# Patient Record
Sex: Female | Born: 1990 | Hispanic: Yes | Marital: Single | Smoking: Never smoker
Health system: Southern US, Community
[De-identification: ages and names within clinical notes are randomized; demographics above are authoritative.]

## PROBLEM LIST (undated history)

## (undated) DIAGNOSIS — O24419 Gestational diabetes mellitus in pregnancy, unspecified control: Secondary | ICD-10-CM

## (undated) DIAGNOSIS — I10 Essential (primary) hypertension: Secondary | ICD-10-CM

## (undated) DIAGNOSIS — O139 Gestational [pregnancy-induced] hypertension without significant proteinuria, unspecified trimester: Secondary | ICD-10-CM

## (undated) HISTORY — PX: NO PAST SURGERIES: SHX2092

## (undated) HISTORY — DX: Gestational (pregnancy-induced) hypertension without significant proteinuria, unspecified trimester: O13.9

## (undated) HISTORY — PX: CHOLECYSTECTOMY: SHX55

## (undated) HISTORY — DX: Essential (primary) hypertension: I10

---

## 2018-07-11 DIAGNOSIS — I10 Essential (primary) hypertension: Secondary | ICD-10-CM | POA: Insufficient documentation

## 2018-12-26 ENCOUNTER — Ambulatory Visit (INDEPENDENT_AMBULATORY_CARE_PROVIDER_SITE_OTHER): Payer: BC Managed Care – PPO

## 2018-12-26 ENCOUNTER — Other Ambulatory Visit: Payer: Self-pay

## 2018-12-26 VITALS — Ht 59.0 in

## 2018-12-26 DIAGNOSIS — Z3201 Encounter for pregnancy test, result positive: Secondary | ICD-10-CM

## 2018-12-26 DIAGNOSIS — Z3401 Encounter for supervision of normal first pregnancy, first trimester: Secondary | ICD-10-CM

## 2018-12-26 DIAGNOSIS — Z3490 Encounter for supervision of normal pregnancy, unspecified, unspecified trimester: Secondary | ICD-10-CM | POA: Insufficient documentation

## 2018-12-26 LAB — POCT URINE PREGNANCY: Preg Test, Ur: POSITIVE — AB

## 2018-12-26 MED ORDER — NIFEDIPINE ER OSMOTIC RELEASE 30 MG PO TB24: 30.0000 mg | ORAL_TABLET | Freq: Every day | ORAL | 0 refills | Status: DC

## 2018-12-26 MED ORDER — BLOOD PRESSURE MONITOR/M CUFF MISC: 0 refills | Status: DC

## 2018-12-26 MED ORDER — BLOOD PRESSURE KIT: PACK | 0 refills | Status: DC

## 2018-12-26 NOTE — Progress Notes (Signed)
Connie Mccarty presents today for UPT. She has no unusual complaints . LMP:11/04/18    OBJECTIVE: Appears well, in no apparent distress.  OB History    Gravida  1   Para      Term      Preterm      AB      Living        SAB      TAB      Ectopic      Multiple      Live Births             Home UPT Result: Positive In-Office UPT result: Posivite I have reviewed the patient's medical, obstetrical, social, and family histories, and medications.   ASSESSMENT: Positive pregnancy test  PLAN Prenatal care to be completed at: [facility]

## 2018-12-27 NOTE — Progress Notes (Signed)
I have reviewed this chart and agree with the RN/CMA assessment and management.    K. Meryl Davis, M.D. Attending [facility] (Faculty Practice)   

## 2019-01-04 ENCOUNTER — Encounter: Payer: Self-pay | Admitting: Women's Health

## 2019-01-04 ENCOUNTER — Ambulatory Visit (INDEPENDENT_AMBULATORY_CARE_PROVIDER_SITE_OTHER): Payer: BC Managed Care – PPO | Admitting: Women's Health

## 2019-01-04 ENCOUNTER — Other Ambulatory Visit: Payer: Self-pay

## 2019-01-04 VITALS — BP 155/100 | HR 106 | Wt 159.0 lb

## 2019-01-04 DIAGNOSIS — O10919 Unspecified pre-existing hypertension complicating pregnancy, unspecified trimester: Secondary | ICD-10-CM

## 2019-01-04 DIAGNOSIS — Z3401 Encounter for supervision of normal first pregnancy, first trimester: Secondary | ICD-10-CM

## 2019-01-04 NOTE — Patient Instructions (Signed)
The Maternity Assessment Unit (MAU) is located at the [facility]. The address is: [address], [place] [address], [place], [place] [id number]. Please see map below for additional directions.    The Maternity Assessment Unit is designed to help you during your pregnancy, and for up to 6 weeks after delivery, with any pregnancy- or postpartum-related emergencies, if you think you are in labor, or if your water has broken. For example, if you experience nausea and vomiting, vaginal bleeding, severe abdominal or pelvic pain, elevated blood pressure or other problems related to your pregnancy or postpartum time, please come to the Maternity Assessment Unit for assistance.                   Safe Medications in Pregnancy    Acne: Benzoyl Peroxide Salicylic Acid  Backache/Headache: Tylenol: 2 regular strength every 4 hours OR              2 Extra strength every 6 hours  Colds/Coughs/Allergies: Benadryl (alcohol free) 25 mg every 6 hours as needed Breath right strips Claritin Cepacol throat lozenges Chloraseptic throat spray Cold-Eeze- up to three times per day Cough drops, alcohol free Flonase (by prescription only) Guaifenesin Mucinex Robitussin DM (plain only, alcohol free) Saline nasal spray/drops Sudafed (pseudoephedrine) & Actifed ** use only after [redacted] weeks gestation and if you do not have high blood pressure Tylenol Vicks Vaporub Zinc lozenges Zyrtec   Constipation: Colace Ducolax suppositories Fleet enema Glycerin suppositories Metamucil Milk of magnesia Miralax Senokot Smooth move tea  Diarrhea: Kaopectate Imodium A-D  *NO pepto Bismol  Hemorrhoids: Anusol Anusol HC Preparation H Tucks  Indigestion: Tums Maalox Mylanta Zantac  Pepcid  Insomnia: Benadryl (alcohol free) 25mg  every 6 hours as needed Tylenol PM Unisom, no Gelcaps  Leg Cramps: Tums MagGel  Nausea/Vomiting:  Bonine Dramamine Emetrol  Ginger extract Sea bands Meclizine  Nausea medication to take during pregnancy:  Unisom (doxylamine succinate 25 mg tablets) Take one tablet daily at bedtime. If symptoms are not adequately controlled, the dose can be increased to a maximum recommended dose of two tablets daily (1/2 tablet in the morning, 1/2 tablet mid-afternoon and one at bedtime). Vitamin B6 100mg  tablets. Take one tablet twice a day (up to 200 mg per day).  Skin Rashes: Aveeno products Benadryl cream or 25mg  every 6 hours as needed Calamine Lotion 1% cortisone cream  Yeast infection: Gyne-lotrimin 7 Monistat 7   **If taking multiple medications, please check labels to avoid duplicating the same active ingredients **take medication as directed on the label ** Do not exceed 4000 mg of tylenol in 24 hours **Do not take medications that contain aspirin or ibuprofen     Hypertension During Pregnancy High blood pressure (hypertension) is when the force of blood pumping through the arteries is too strong. Arteries are blood vessels that carry blood from the heart throughout the body. Hypertension during pregnancy can be mild or severe. Severe hypertension during pregnancy (preeclampsia) is a medical emergency that requires prompt evaluation and treatment. Different types of hypertension can happen during pregnancy. These include:  Chronic hypertension. This happens when you had high blood pressure before you became pregnant, and it continues during the pregnancy. Hypertension that develops before you are [redacted] weeks pregnant and continues during the pregnancy is also called chronic hypertension. If you have chronic hypertension, it will not go away after you have your baby. You will need follow-up visits with your health care provider after you have  your baby. Your doctor may want you to keep taking medicine for your blood pressure.  Gestational hypertension. This is hypertension that develops after the 20th week of  pregnancy. Gestational hypertension usually goes away after you have your baby, but your health care provider will need to monitor your blood pressure to make sure that it is getting better.  Preeclampsia. This is severe hypertension during pregnancy. This can cause serious complications for you and your baby and can also cause complications for you after the delivery of your baby.  Postpartum preeclampsia. You may develop severe hypertension after giving birth. This usually occurs within 48 hours after childbirth but may occur up to 6 weeks after giving birth. This is rare. How does this affect me? Women who have hypertension during pregnancy have a greater chance of developing hypertension later in life or during future pregnancies. In some cases, hypertension during pregnancy can cause serious complications, such as:  Stroke.  Heart attack.  Injury to other organs, such as kidneys, lungs, or liver.  Preeclampsia.  Convulsions or seizures.  Placental abruption. How does this affect my baby? Hypertension during pregnancy can affect your baby. Your baby may:  Be born early (prematurely).  Not weigh as much as he or she should at birth (low birth weight).  Not tolerate labor well, leading to an unplanned cesarean delivery. What are the risks? There are certain factors that make it more likely for you to develop hypertension during pregnancy. These include:  Having hypertension during a previous pregnancy.  Being overweight.  Being age 6 or older.  Being pregnant for the first time.  Being pregnant with more than one baby.  Becoming pregnant using fertilization methods, such as IVF (in vitro fertilization).  Having other medical problems, such as diabetes, kidney disease, or lupus.  Having a family history of hypertension. What can I do to lower my risk? The exact cause of hypertension during pregnancy is not known. You may be able to lower your risk by:  Maintaining a  healthy weight.  Eating a healthy and balanced diet.  Following your health care provider's instructions about treating any long-term conditions that you had before becoming pregnant. It is very important to keep all of your prenatal care appointments. Your health care provider will check your blood pressure and make sure that your pregnancy is progressing as expected. If a problem is found, early treatment can prevent complications. How is this treated? Treatment for hypertension during pregnancy varies depending on the type of hypertension you have and how serious it is.  If you were taking medicine for high blood pressure before you became pregnant, talk with your health care provider. You may need to change medicine during pregnancy because some medicines, like ACE inhibitors, may not be considered safe for your baby.  If you have gestational hypertension, your health care provider may order medicine to treat this during pregnancy.  If you are at risk for preeclampsia, your health care provider may recommend that you take a low-dose aspirin during your pregnancy.  If you have severe hypertension, you may need to be hospitalized so you and your baby can be monitored closely. You may also need to be given medicine to lower your blood pressure. This medicine may be given by mouth or through an IV.  In some cases, if your condition gets worse, you may need to deliver your baby early. Follow these instructions at home: Eating and drinking   Drink enough fluid to keep your urine  pale yellow.  Avoid caffeine. Lifestyle  Do not use any products that contain nicotine or tobacco, such as cigarettes, e-cigarettes, and chewing tobacco. If you need help quitting, ask your health care provider.  Do not use alcohol or drugs.  Avoid stress as much as possible.  Rest and get plenty of sleep.  Regular exercise can help to reduce your blood pressure. Ask your health care provider what kinds of  exercise are best for you. General instructions  Take over-the-counter and prescription medicines only as told by your health care provider.  Keep all prenatal and follow-up visits as told by your health care provider. This is important. Contact a health care provider if:  You have symptoms that your health care provider told you may require more treatment or monitoring, such as: ? Headaches. ? Nausea or vomiting. ? Abdominal pain. ? Dizziness. ? Light-headedness. Get help right away if:  You have: ? Severe abdominal pain that does not get better with treatment. ? A severe headache that does not get better. ? Vomiting that does not get better. ? Sudden, rapid weight gain. ? Sudden swelling in your hands, ankles, or face. ? Vaginal bleeding. ? Blood in your urine. ? Blurred or double vision. ? Shortness of breath or chest pain. ? Weakness on one side of your body. ? Difficulty speaking.  Your baby is not moving as much as usual. Summary  High blood pressure (hypertension) is when the force of blood pumping through the arteries is too strong.  Hypertension during pregnancy can cause problems for you and your baby.  Treatment for hypertension during pregnancy varies depending on the type of hypertension you have and how serious it is.  Keep all prenatal and follow-up visits as told by your health care provider. This is important. This information is not intended to replace advice given to you by your health care provider. Make sure you discuss any questions you have with your health care provider. Document Released: 10/20/2010 Document Revised: 05/25/2018 Document Reviewed: 02/28/2018 Elsevier Patient Education  2020 [place] of Pregnancy The first trimester of pregnancy is from week 1 until the end of week 13 (months 1 through 3). A week after a sperm fertilizes an egg, the egg will implant on the wall of the uterus. This embryo will begin to  develop into a baby. Genes from you and your partner will form the baby. The female genes will determine whether the baby will be a boy or a girl. At 6-8 weeks, the eyes and face will be formed, and the heartbeat can be seen on ultrasound. At the end of 12 weeks, all the baby's organs will be formed. Now that you are pregnant, you will want to do everything you can to have a healthy baby. Two of the most important things are to get good prenatal care and to follow your health care provider's instructions. Prenatal care is all the medical care you receive before the baby's birth. This care will help prevent, find, and treat any problems during the pregnancy and childbirth. Body changes during your first trimester Your body goes through many changes during pregnancy. The changes vary from woman to woman.  You may gain or lose a couple of pounds at first.  You may feel sick to your stomach (nauseous) and you may throw up (vomit). If the vomiting is uncontrollable, call your health care provider.  You may tire easily.  You may develop headaches that can  be relieved by medicines. All medicines should be approved by your health care provider.  You may urinate more often. Painful urination may mean you have a bladder infection.  You may develop heartburn as a result of your pregnancy.  You may develop constipation because certain hormones are causing the muscles that push stool through your intestines to slow down.  You may develop hemorrhoids or swollen veins (varicose veins).  Your breasts may begin to grow larger and become tender. Your nipples may stick out more, and the tissue that surrounds them (areola) may become darker.  Your gums may bleed and may be sensitive to brushing and flossing.  Dark spots or blotches (chloasma, mask of pregnancy) may develop on your face. This will likely fade after the baby is born.  Your menstrual periods will stop.  You may have a loss of appetite.  You  may develop cravings for certain kinds of food.  You may have changes in your emotions from day to day, such as being excited to be pregnant or being concerned that something may go wrong with the pregnancy and baby.  You may have more vivid and strange dreams.  You may have changes in your hair. These can include thickening of your hair, rapid growth, and changes in texture. Some women also have hair loss during or after pregnancy, or hair that feels dry or thin. Your hair will most likely return to normal after your baby is born. What to expect at prenatal visits During a routine prenatal visit:  You will be weighed to make sure you and the baby are growing normally.  Your blood pressure will be taken.  Your abdomen will be measured to track your baby's growth.  The fetal heartbeat will be listened to between weeks 10 and 14 of your pregnancy.  Test results from any previous visits will be discussed. Your health care provider may ask you:  How you are feeling.  If you are feeling the baby move.  If you have had any abnormal symptoms, such as leaking fluid, bleeding, severe headaches, or abdominal cramping.  If you are using any tobacco products, including cigarettes, chewing tobacco, and electronic cigarettes.  If you have any questions. Other tests that may be performed during your first trimester include:  Blood tests to find your blood type and to check for the presence of any previous infections. The tests will also be used to check for low iron levels (anemia) and protein on red blood cells (Rh antibodies). Depending on your risk factors, or if you previously had diabetes during pregnancy, you may have tests to check for high blood sugar that affects pregnant women (gestational diabetes).  Urine tests to check for infections, diabetes, or protein in the urine.  An ultrasound to confirm the proper growth and development of the baby.  Fetal screens for spinal cord problems  (spina bifida) and Down syndrome.  HIV (human immunodeficiency virus) testing. Routine prenatal testing includes screening for HIV, unless you choose not to have this test.  You may need other tests to make sure you and the baby are doing well. Follow these instructions at home: Medicines  Follow your health care provider's instructions regarding medicine use. Specific medicines may be either safe or unsafe to take during pregnancy.  Take a prenatal vitamin that contains at least 600 micrograms (mcg) of folic acid.  If you develop constipation, try taking a stool softener if your health care provider approves. Eating and drinking   Eat  a balanced diet that includes fresh fruits and vegetables, whole grains, good sources of protein such as meat, eggs, or tofu, and low-fat dairy. Your health care provider will help you determine the amount of weight gain that is right for you.  Avoid raw meat and uncooked cheese. These carry germs that can cause birth defects in the baby.  Eating four or five small meals rather than three large meals a day may help relieve nausea and vomiting. If you start to feel nauseous, eating a few soda crackers can be helpful. Drinking liquids between meals, instead of during meals, also seems to help ease nausea and vomiting.  Limit foods that are high in fat and processed sugars, such as fried and sweet foods.  To prevent constipation: ? Eat foods that are high in fiber, such as fresh fruits and vegetables, whole grains, and beans. ? Drink enough fluid to keep your urine clear or pale yellow. Activity  Exercise only as directed by your health care provider. Most women can continue their usual exercise routine during pregnancy. Try to exercise for 30 minutes at least 5 days a week. Exercising will help you: ? Control your weight. ? Stay in shape. ? Be prepared for labor and delivery.  Experiencing pain or cramping in the lower abdomen or lower back is a good  sign that you should stop exercising. Check with your health care provider before continuing with normal exercises.  Try to avoid standing for long periods of time. Move your legs often if you must stand in one place for a long time.  Avoid heavy lifting.  Wear low-heeled shoes and practice good posture.  You may continue to have sex unless your health care provider tells you not to. Relieving pain and discomfort  Wear a good support bra to relieve breast tenderness.  Take warm sitz baths to soothe any pain or discomfort caused by hemorrhoids. Use hemorrhoid cream if your health care provider approves.  Rest with your legs elevated if you have leg cramps or low back pain.  If you develop varicose veins in your legs, wear support hose. Elevate your feet for 15 minutes, 3-4 times a day. Limit salt in your diet. Prenatal care  Schedule your prenatal visits by the twelfth week of pregnancy. They are usually scheduled monthly at first, then more often in the last 2 months before delivery.  Write down your questions. Take them to your prenatal visits.  Keep all your prenatal visits as told by your health care provider. This is important. Safety  Wear your seat belt at all times when driving.  Make a list of emergency phone numbers, including numbers for family, friends, the hospital, and police and fire departments. General instructions  Ask your health care provider for a referral to a local prenatal education class. Begin classes no later than the beginning of month 6 of your pregnancy.  Ask for help if you have counseling or nutritional needs during pregnancy. Your health care provider can offer advice or refer you to specialists for help with various needs.  Do not use hot tubs, steam rooms, or saunas.  Do not douche or use tampons or scented sanitary pads.  Do not cross your legs for long periods of time.  Avoid cat litter boxes and soil used by cats. These carry germs that  can cause birth defects in the baby and possibly loss of the fetus by miscarriage or stillbirth.  Avoid all smoking, herbs, alcohol, and medicines not prescribed  by your health care provider. Chemicals in these products affect the formation and growth of the baby.  Do not use any products that contain nicotine or tobacco, such as cigarettes and e-cigarettes. If you need help quitting, ask your health care provider. You may receive counseling support and other resources to help you quit.  Schedule a dentist appointment. At home, brush your teeth with a soft toothbrush and be gentle when you floss. Contact a health care provider if:  You have dizziness.  You have mild pelvic cramps, pelvic pressure, or nagging pain in the abdominal area.  You have persistent nausea, vomiting, or diarrhea.  You have a bad smelling vaginal discharge.  You have pain when you urinate.  You notice increased swelling in your face, hands, legs, or ankles.  You are exposed to fifth disease or chickenpox.  You are exposed to [place] measles (rubella) and have never had it. Get help right away if:  You have a fever.  You are leaking fluid from your vagina.  You have spotting or bleeding from your vagina.  You have severe abdominal cramping or pain.  You have rapid weight gain or loss.  You vomit blood or material that looks like coffee grounds.  You develop a severe headache.  You have shortness of breath.  You have any kind of trauma, such as from a fall or a car accident. Summary  The first trimester of pregnancy is from week 1 until the end of week 13 (months 1 through 3).  Your body goes through many changes during pregnancy. The changes vary from woman to woman.  You will have routine prenatal visits. During those visits, your health care provider will examine you, discuss any test results you may have, and talk with you about how you are feeling. This information is not intended to replace  advice given to you by your health care provider. Make sure you discuss any questions you have with your health care provider. Document Released: 01/26/2001 Document Revised: 01/14/2017 Document Reviewed: 01/14/2016 Elsevier Patient Education  2020 [employer] Preeclampsia and Eclampsia Preeclampsia is a serious condition that may develop during pregnancy. This condition causes high blood pressure and increased protein in your urine along with other symptoms, such as headaches and vision changes. These symptoms may develop as the condition gets worse. Preeclampsia may occur at 20 weeks of pregnancy or later. Diagnosing and treating preeclampsia early is very important. If not treated early, it can cause serious problems for you and your baby. One problem it can lead to is eclampsia. Eclampsia is a condition that causes muscle jerking or shaking (convulsions or seizures) and other serious problems for the mother. During pregnancy, delivering your baby may be the best treatment for preeclampsia or eclampsia. For most women, preeclampsia and eclampsia symptoms go away after giving birth. In rare cases, a woman may develop preeclampsia after giving birth (postpartum preeclampsia). This usually occurs within 48 hours after childbirth but may occur up to 6 weeks after giving birth. What are the causes? The cause of preeclampsia is not known. What increases the risk? The following risk factors make you more likely to develop preeclampsia:  Being pregnant for the first time.  Having had preeclampsia during a past pregnancy.  Having a family history of preeclampsia.  Having high blood pressure.  Being pregnant with more than one baby.  Being 57 or older.  Being African-American.  Having kidney disease or diabetes.  Having medical conditions such as lupus or blood  diseases.  Being very overweight (obese). What are the signs or symptoms? The most common symptoms are:  Severe headaches.   Vision problems, such as blurred or double vision.  Abdominal pain, especially upper abdominal pain. Other symptoms that may develop as the condition gets worse include:  Sudden weight gain.  Sudden swelling of the hands, face, legs, and feet.  Severe nausea and vomiting.  Numbness in the face, arms, legs, and feet.  Dizziness.  Urinating less than usual.  Slurred speech.  Convulsions or seizures. How is this diagnosed? There are no screening tests for preeclampsia. Your health care provider will ask you about symptoms and check for signs of preeclampsia during your prenatal visits. You may also have tests that include:  Checking your blood pressure.  Urine tests to check for protein. Your health care provider will check for this at every prenatal visit.  Blood tests.  Monitoring your baby's heart rate.  Ultrasound. How is this treated? You and your health care provider will determine the treatment approach that is best for you. Treatment may include:  Having more frequent prenatal exams to check for signs of preeclampsia, if you have an increased risk for preeclampsia.  Medicine to lower your blood pressure.  Staying in the hospital, if your condition is severe. There, treatment will focus on controlling your blood pressure and the amount of fluids in your body (fluid retention).  Taking medicine (magnesium sulfate) to prevent seizures. This may be given as an injection or through an IV.  Taking a low-dose aspirin during your pregnancy.  Delivering your baby early. You may have your labor started with medicine (induced), or you may have a cesarean delivery. Follow these instructions at home: Eating and drinking   Drink enough fluid to keep your urine pale yellow.  Avoid caffeine. Lifestyle  Do not use any products that contain nicotine or tobacco, such as cigarettes and e-cigarettes. If you need help quitting, ask your health care provider.  Do not use  alcohol or drugs.  Avoid stress as much as possible. Rest and get plenty of sleep. General instructions  Take over-the-counter and prescription medicines only as told by your health care provider.  When lying down, lie on your left side. This keeps pressure off your major blood vessels.  When sitting or lying down, raise (elevate) your feet. Try putting some pillows underneath your lower legs.  Exercise regularly. Ask your health care provider what kinds of exercise are best for you.  Keep all follow-up and prenatal visits as told by your health care provider. This is important. How is this prevented? There is no known way of preventing preeclampsia or eclampsia from developing. However, to lower your risk of complications and detect problems early:  Get regular prenatal care. Your health care provider may be able to diagnose and treat the condition early.  Maintain a healthy weight. Ask your health care provider for help managing weight gain during pregnancy.  Work with your health care provider to manage any long-term (chronic) health conditions you have, such as diabetes or kidney problems.  You may have tests of your blood pressure and kidney function after giving birth.  Your health care provider may have you take low-dose aspirin during your next pregnancy. Contact a health care provider if:  You have symptoms that your health care provider told you may require more treatment or monitoring, such as: ? Headaches. ? Nausea or vomiting. ? Abdominal pain. ? Dizziness. ? Light-headedness. Get help right away  if:  You have severe: ? Abdominal pain. ? Headaches that do not get better. ? Dizziness. ? Vision problems. ? Confusion. ? Nausea or vomiting.  You have any of the following: ? A seizure. ? Sudden, rapid weight gain. ? Sudden swelling in your hands, ankles, or face. ? Trouble moving any part of your body. ? Numbness in any part of your body. ? Trouble speaking.  ? Abnormal bleeding.  You faint. Summary  Preeclampsia is a serious condition that may develop during pregnancy.  This condition causes high blood pressure and increased protein in your urine along with other symptoms, such as headaches and vision changes.  Diagnosing and treating preeclampsia early is very important. If not treated early, it can cause serious problems for you and your baby.  Get help right away if you have symptoms that your health care provider told you to watch for. This information is not intended to replace advice given to you by your health care provider. Make sure you discuss any questions you have with your health care provider. Document Released: 01/30/2000 Document Revised: 10/04/2017 Document Reviewed: 09/08/2015 Elsevier Patient Education  2020 [employer].

## 2019-01-04 NOTE — Progress Notes (Signed)
History:   Connie Mccarty is a 28 y.o. G1P0 at 22w5dby LMP being seen today for her first obstetrical visit. FHT unable to be obtained by Doppler or bedside [place] Will defer NOB visit pending viability scan. Pt aware and agrees with plan.  Patient reports no complaints.      HISTORY: OB History  Gravida Para Term Preterm AB Living  1 0 0 0 0 0  SAB TAB Ectopic Multiple Live Births  0 0 0 0 0    # Outcome Date GA Lbr Len/2nd Weight Sex Delivery Anes PTL Lv  1 Current             Last pap smear was done 07/2018 - NILM.  Past Medical History:  Diagnosis Date  . Hypertension    History reviewed. No pertinent surgical history. Family History  Problem Relation Age of Onset  . Diabetes Father   . Diabetes Mother    Social History   Tobacco Use  . Smoking status: Never Smoker  . Smokeless tobacco: Never Used  Substance Use Topics  . Alcohol use: Never    Frequency: Never  . Drug use: Never   No Known Allergies Current Outpatient Medications on File Prior to Visit  Medication Sig Dispense Refill  . Blood Pressure KIT Use as directed 1 kit 0  . NIFEdipine (PROCARDIA XL) 30 MG 24 hr tablet Take 1 tablet (30 mg total) by mouth daily. 30 tablet 0  . Blood Pressure Monitoring (BLOOD PRESSURE MONITOR/M CUFF) MISC Use as directed 1 each 0   No current facility-administered medications on file prior to visit.     Review of Systems Pertinent items noted in HPI and remainder of comprehensive ROS otherwise negative. Physical Exam:   Vitals:   01/04/19 1407 01/04/19 1424  BP: (!) 157/106 (!) 155/100  Pulse: (!) 106   Weight: 159 lb (72.1 kg)      Uterus:    not evaluated  Pelvic Exam: Perineum: not evaluated   Vulva: not evaluated   Vagina:  not evaluated   Cervix: not evaluated    Adnexa: not evaluated   Bony Pelvis: not evaluated  System: General: well-developed, well-nourished female in no acute distress   Breasts:  not evaluated   Skin: normal coloration and  turgor, no rashes   Neurologic: oriented, normal, negative, normal mood   Extremities: normal strength, tone, and muscle mass, ROM of all joints is normal   HEENT PERRLA, extraocular movement intact and sclera clear, anicteric   Mouth/Teeth not evaluated   Neck not evaluated   Cardiovascular: not evaluated   Respiratory:  no respiratory distress   Abdomen: soft  Bedside Ultrasound for FHR check: FHTs no able to be obtained with bedside [place] Pt reports no early formal [place] Sac appears irregularly shaped. Patient informed that the ultrasound is considered a limited obstetric ultrasound and is not intended to be a complete ultrasound exam.  Patient also informed that the ultrasound is not being completed with the intent of assessing for fetal or placental anomalies or any pelvic abnormalities.  Explained that the purpose of today's ultrasound is to assess for fetal heart rate.  Patient acknowledges the purpose of the exam and the limitations of the study.     Assessment:    Pregnancy: G1P0 Patient Active Problem List   Diagnosis Date Noted  . Chronic hypertension affecting pregnancy 01/04/2019  . Supervision of normal pregnancy 12/26/2018     Plan:    1. Encounter for  supervision of normal first pregnancy in first trimester - reschedule NOB visit and labs pending [place]  2. Chronic hypertension affecting pregnancy - anticipatory guidance given on visits and testing - increase Procardia 67m XR once per day (per consultation with Dr. DRosana Hoes - ASA '@12wks'  - baseline labs pending viability [place]  Return in about 1 week (around 01/11/2019) for BP check only, needs viability scan ASAP, NOB 2wks.    NClarisa Fling NP  3:35 PM 01/04/2019 [facility] [employer] [facility]

## 2019-01-04 NOTE — Progress Notes (Signed)
Pt presents for NOB visit. Normal pap 08/14/2018 Elevated BP today, asymptomatic, 1 tab po Procardia 30 mg at 1245 today.

## 2019-01-05 LAB — PROTEIN / CREATININE RATIO, URINE
Creatinine, Urine: 112 mg/dL
Protein, Ur: 9.7 mg/dL
Protein/Creat Ratio: 87 mg/g{creat} (ref 0–200)

## 2019-01-08 LAB — CULTURE, OB URINE

## 2019-01-08 LAB — URINE CULTURE, OB REFLEX

## 2019-01-10 ENCOUNTER — Other Ambulatory Visit: Payer: Self-pay

## 2019-01-10 ENCOUNTER — Ambulatory Visit: Payer: BC Managed Care – PPO

## 2019-01-10 VITALS — BP 150/97 | HR 103

## 2019-01-10 DIAGNOSIS — Z0130 Encounter for examination of blood pressure without abnormal findings: Secondary | ICD-10-CM

## 2019-01-10 NOTE — Progress Notes (Signed)
..  Subjective:  Connie Mccarty is a 28 y.o. female here for BP check.   Hypertension ROS: taking medications as instructed, no medication side effects noted, no TIA's, no chest pain on exertion, no dyspnea on exertion and no swelling of ankles.    Objective:  BP (!) 150/95   Pulse (!) 112   LMP 11/04/2018   Appearance alert, well appearing, and in no distress. General exam BP noted to be well controlled today in office.    Assessment:   Blood Pressure needs improvement.   Plan:  Consulted with provider and advised patient to continue Procardia, but take 30mg  BID versus taking 60mg  once a day, pt agreed. Next appt 01-22-19 for NOB.

## 2019-01-11 NOTE — Progress Notes (Signed)
Patient seen and assessed by nursing staff during this encounter. I have reviewed the chart and agree with the documentation and plan.  Mora Bellman, MD 01/11/2019 8:52 AM

## 2019-01-17 ENCOUNTER — Ambulatory Visit (HOSPITAL_COMMUNITY): Payer: BC Managed Care – PPO

## 2019-01-17 ENCOUNTER — Other Ambulatory Visit: Payer: Self-pay

## 2019-01-17 MED ORDER — NIFEDIPINE ER OSMOTIC RELEASE 30 MG PO TB24: 30.0000 mg | ORAL_TABLET | Freq: Every day | ORAL | 3 refills | Status: DC

## 2019-01-19 ENCOUNTER — Other Ambulatory Visit: Payer: Self-pay | Admitting: Obstetrics and Gynecology

## 2019-01-22 ENCOUNTER — Encounter: Payer: BC Managed Care – PPO | Admitting: Advanced Practice Midwife

## 2019-02-15 LAB — OB RESULTS CONSOLE RUBELLA ANTIBODY, IGM: Rubella: IMMUNE

## 2019-02-15 LAB — OB RESULTS CONSOLE HEPATITIS B SURFACE ANTIGEN: Hepatitis B Surface Ag: NEGATIVE

## 2019-02-15 LAB — OB RESULTS CONSOLE RPR: RPR: NONREACTIVE

## 2019-02-15 LAB — OB RESULTS CONSOLE ABO/RH: RH Type: POSITIVE

## 2019-02-15 LAB — OB RESULTS CONSOLE GC/CHLAMYDIA
Chlamydia: NEGATIVE
Gonorrhea: NEGATIVE

## 2019-02-15 LAB — OB RESULTS CONSOLE HIV ANTIBODY (ROUTINE TESTING): HIV: NONREACTIVE

## 2019-02-15 LAB — OB RESULTS CONSOLE ANTIBODY SCREEN: Antibody Screen: NEGATIVE

## 2019-03-21 ENCOUNTER — Other Ambulatory Visit: Payer: Self-pay | Admitting: Obstetrics and Gynecology

## 2019-06-12 ENCOUNTER — Other Ambulatory Visit: Payer: Self-pay | Admitting: Obstetrics and Gynecology

## 2019-06-19 ENCOUNTER — Encounter (HOSPITAL_COMMUNITY): Payer: Self-pay | Admitting: Obstetrics and Gynecology

## 2019-06-19 ENCOUNTER — Other Ambulatory Visit: Payer: Self-pay

## 2019-06-19 ENCOUNTER — Inpatient Hospital Stay (HOSPITAL_COMMUNITY)
Admission: AD | Admit: 2019-06-19 | Discharge: 2019-06-19 | Disposition: A | Discharge status: Home / Self Care | Payer: BC Managed Care – PPO | Attending: Obstetrics and Gynecology | Admitting: Obstetrics and Gynecology

## 2019-06-19 DIAGNOSIS — Z3A32 32 weeks gestation of pregnancy: Secondary | ICD-10-CM | POA: Diagnosis not present

## 2019-06-19 DIAGNOSIS — O10013 Pre-existing essential hypertension complicating pregnancy, third trimester: Secondary | ICD-10-CM | POA: Diagnosis not present

## 2019-06-19 DIAGNOSIS — O113 Pre-existing hypertension with pre-eclampsia, third trimester: Secondary | ICD-10-CM | POA: Diagnosis not present

## 2019-06-19 DIAGNOSIS — R030 Elevated blood-pressure reading, without diagnosis of hypertension: Secondary | ICD-10-CM | POA: Diagnosis present

## 2019-06-19 DIAGNOSIS — O119 Pre-existing hypertension with pre-eclampsia, unspecified trimester: Secondary | ICD-10-CM

## 2019-06-19 LAB — CBC
HCT: 37.2 % (ref 36.0–46.0)
Hemoglobin: 11.9 g/dL — ABNORMAL LOW (ref 12.0–15.0)
MCH: 27.3 pg (ref 26.0–34.0)
MCHC: 32 g/dL (ref 30.0–36.0)
MCV: 85.3 fL (ref 80.0–100.0)
Platelets: 317 10*3/uL (ref 150–400)
RBC: 4.36 MIL/uL (ref 3.87–5.11)
RDW: 16.4 % — ABNORMAL HIGH (ref 11.5–15.5)
WBC: 10.3 10*3/uL (ref 4.0–10.5)
nRBC: 0 % (ref 0.0–0.2)

## 2019-06-19 LAB — URINALYSIS, ROUTINE W REFLEX MICROSCOPIC
Bacteria, UA: NONE SEEN
Bilirubin Urine: NEGATIVE
Glucose, UA: NEGATIVE mg/dL
Ketones, ur: NEGATIVE mg/dL
Leukocytes,Ua: NEGATIVE
Nitrite: NEGATIVE
Protein, ur: NEGATIVE mg/dL
Specific Gravity, Urine: 1.004 — ABNORMAL LOW (ref 1.005–1.030)
pH: 6 (ref 5.0–8.0)

## 2019-06-19 LAB — COMPREHENSIVE METABOLIC PANEL
ALT: 15 U/L (ref 0–44)
AST: 19 U/L (ref 15–41)
Albumin: 2.9 g/dL — ABNORMAL LOW (ref 3.5–5.0)
Alkaline Phosphatase: 120 U/L (ref 38–126)
Anion gap: 12 (ref 5–15)
BUN: 5 mg/dL — ABNORMAL LOW (ref 6–20)
CO2: 18 mmol/L — ABNORMAL LOW (ref 22–32)
Calcium: 9.3 mg/dL (ref 8.9–10.3)
Chloride: 107 mmol/L (ref 98–111)
Creatinine, Ser: 0.61 mg/dL (ref 0.44–1.00)
GFR calc Af Amer: >60 mL/min (ref >60)
GFR calc non Af Amer: >60 mL/min (ref >60)
Glucose, Bld: 93 mg/dL (ref 70–99)
Potassium: 4.1 mmol/L (ref 3.5–5.1)
Sodium: 137 mmol/L (ref 135–145)
Total Bilirubin: 0.3 mg/dL (ref 0.3–1.2)
Total Protein: 6.8 g/dL (ref 6.5–8.1)

## 2019-06-19 LAB — PROTEIN / CREATININE RATIO, URINE
Creatinine, Urine: 28.81 mg/dL
Protein Creatinine Ratio: 0.42 mg/mg{Cre} — ABNORMAL HIGH (ref 0.00–0.15)
Total Protein, Urine: 12 mg/dL

## 2019-06-19 MED ORDER — LABETALOL HCL 5 MG/ML IV SOLN: 80.0000 mg | INTRAVENOUS | Status: DC | PRN

## 2019-06-19 MED ORDER — LABETALOL HCL 5 MG/ML IV SOLN: 40.0000 mg | INTRAVENOUS | Status: DC | PRN

## 2019-06-19 MED ORDER — NIFEDIPINE ER OSMOTIC RELEASE 60 MG PO TB24
60.0000 mg | ORAL_TABLET | Freq: Every day | ORAL | 0 refills | Status: DC
Start: 2019-06-19 — End: 2019-07-26

## 2019-06-19 MED ORDER — LACTATED RINGERS IV SOLN: INTRAVENOUS | Status: DC

## 2019-06-19 MED ORDER — HYDRALAZINE HCL 20 MG/ML IJ SOLN: 10.0000 mg | INTRAMUSCULAR | Status: DC | PRN

## 2019-06-19 MED ORDER — LABETALOL HCL 5 MG/ML IV SOLN
20.0000 mg | INTRAVENOUS | Status: DC | PRN
  Administered 2019-06-19: 20 mg via INTRAVENOUS
  Filled 2019-06-19: qty 4

## 2019-06-19 NOTE — MAU Note (Signed)
.   Connie Mccarty is a 29 y.o. at [redacted]w[redacted]d here in MAU reporting: she was sent from the office for high blood pressure. Denies any complaints. +FM  Onset of complaint: today at appointment Pain score: 0 Vitals:   06/19/19 1755  BP: (!) 147/110  Pulse: 98  Resp: 16  Temp: 98.2 F (36.8 C)  SpO2: 99%     FHT:150 Lab orders placed from triage: UA

## 2019-06-19 NOTE — Discharge Instructions (Signed)
Hypertension During Pregnancy °High blood pressure (hypertension) is when the force of blood pumping through the arteries is too strong. Arteries are blood vessels that carry blood from the heart throughout the body. Hypertension during pregnancy can be mild or severe. Severe hypertension during pregnancy (preeclampsia) is a medical emergency that requires prompt evaluation and treatment. °Different types of hypertension can happen during pregnancy. These include: °· Chronic hypertension. This happens when you had high blood pressure before you became pregnant, and it continues during the pregnancy. Hypertension that develops before you are [redacted] weeks pregnant and continues during the pregnancy is also called chronic hypertension. If you have chronic hypertension, it will not go away after you have your baby. You will need follow-up visits with your health care provider after you have your baby. Your doctor may want you to keep taking medicine for your blood pressure. °· Gestational hypertension. This is hypertension that develops after the 20th week of pregnancy. Gestational hypertension usually goes away after you have your baby, but your health care provider will need to monitor your blood pressure to make sure that it is getting better. °· Preeclampsia. This is severe hypertension during pregnancy. This can cause serious complications for you and your baby and can also cause complications for you after the delivery of your baby. °· Postpartum preeclampsia. You may develop severe hypertension after giving birth. This usually occurs within 48 hours after childbirth but may occur up to 6 weeks after giving birth. This is rare. °How does this affect me? °Women who have hypertension during pregnancy have a greater chance of developing hypertension later in life or during future pregnancies. In some cases, hypertension during pregnancy can cause serious complications, such as: °· Stroke. °· Heart attack. °· Injury to  other organs, such as kidneys, lungs, or liver. °· Preeclampsia. °· Convulsions or seizures. °· Placental abruption. °How does this affect my baby? °Hypertension during pregnancy can affect your baby. Your baby may: °· Be born early (prematurely). °· Not weigh as much as he or she should at birth (low birth weight). °· Not tolerate labor well, leading to an unplanned cesarean delivery. °What are the risks? °There are certain factors that make it more likely for you to develop hypertension during pregnancy. These include: °· Having hypertension during a previous pregnancy. °· Being overweight. °· Being age 35 or older. °· Being pregnant for the first time. °· Being pregnant with more than one baby. °· Becoming pregnant using fertilization methods, such as IVF (in vitro fertilization). °· Having other medical problems, such as diabetes, kidney disease, or lupus. °· Having a family history of hypertension. °What can I do to lower my risk? °The exact cause of hypertension during pregnancy is not known. You may be able to lower your risk by: °· Maintaining a healthy weight. °· Eating a healthy and balanced diet. °· Following your health care provider's instructions about treating any long-term conditions that you had before becoming pregnant. °It is very important to keep all of your prenatal care appointments. Your health care provider will check your blood pressure and make sure that your pregnancy is progressing as expected. If a problem is found, early treatment can prevent complications. °How is this treated? °Treatment for hypertension during pregnancy varies depending on the type of hypertension you have and how serious it is. °· If you were taking medicine for high blood pressure before you became pregnant, talk with your health care provider. You may need to change medicine during pregnancy because   some medicines, like ACE inhibitors, may not be considered safe for your baby. °· If you have gestational  hypertension, your health care provider may order medicine to treat this during pregnancy. °· If you are at risk for preeclampsia, your health care provider may recommend that you take a low-dose aspirin during your pregnancy. °· If you have severe hypertension, you may need to be hospitalized so you and your baby can be monitored closely. You may also need to be given medicine to lower your blood pressure. This medicine may be given by mouth or through an IV. °· In some cases, if your condition gets worse, you may need to deliver your baby early. °Follow these instructions at home: °Eating and drinking ° °· Drink enough fluid to keep your urine pale yellow. °· Avoid caffeine. °Lifestyle °· Do not use any products that contain nicotine or tobacco, such as cigarettes, e-cigarettes, and chewing tobacco. If you need help quitting, ask your health care provider. °· Do not use alcohol or drugs. °· Avoid stress as much as possible. °· Rest and get plenty of sleep. °· Regular exercise can help to reduce your blood pressure. Ask your health care provider what kinds of exercise are best for you. °General instructions °· Take over-the-counter and prescription medicines only as told by your health care provider. °· Keep all prenatal and follow-up visits as told by your health care provider. This is important. °Contact a health care provider if: °· You have symptoms that your health care provider told you may require more treatment or monitoring, such as: °? Headaches. °? Nausea or vomiting. °? Abdominal pain. °? Dizziness. °? Light-headedness. °Get help right away if: °· You have: °? Severe abdominal pain that does not get better with treatment. °? A severe headache that does not get better. °? Vomiting that does not get better. °? Sudden, rapid weight gain. °? Sudden swelling in your hands, ankles, or face. °? Vaginal bleeding. °? Blood in your urine. °? Blurred or double vision. °? Shortness of breath or chest  pain. °? Weakness on one side of your body. °? Difficulty speaking. °· Your baby is not moving as much as usual. °Summary °· High blood pressure (hypertension) is when the force of blood pumping through the arteries is too strong. °· Hypertension during pregnancy can cause problems for you and your baby. °· Treatment for hypertension during pregnancy varies depending on the type of hypertension you have and how serious it is. °· Keep all prenatal and follow-up visits as told by your health care provider. This is important. °This information is not intended to replace advice given to you by your health care provider. Make sure you discuss any questions you have with your health care provider. °Document Revised: 05/25/2018 Document Reviewed: 02/28/2018 °Elsevier Patient Education © 2020 [employer] Inc[employer] ° ° ° ° °Fetal Movement Counts °Patient Name: ________________________________________________ Patient Due Date: ____________________ °What is a fetal movement count? ° °A fetal movement count is the number of times that you feel your baby move during a certain amount of time. This may also be called a fetal kick count. A fetal movement count is recommended for every pregnant woman. You may be asked to start counting fetal movements as early as week 28 of your pregnancy. °Pay attention to when your baby is most active. You may notice your baby's sleep and wake cycles. You may also notice things that make your baby move more. You should do a fetal movement count: °· When   your baby is normally most active. °· At the same time each day. °A good time to count movements is while you are resting, after having something to eat and drink. °How do I count fetal movements? °1. Find a quiet, comfortable area. Sit, or lie down on your side. °2. Write down the date, the start time and stop time, and the number of movements that you felt between those two times. Take this information with you to your health care visits. °3. Write down  your start time when you feel the first movement. °4. Count kicks, flutters, swishes, rolls, and jabs. You should feel at least 10 movements. °5. You may stop counting after you have felt 10 movements, or if you have been counting for 2 hours. Write down the stop time. °6. If you do not feel 10 movements in 2 hours, contact your health care provider for further instructions. Your health care provider may want to do additional tests to assess your baby's well-being. °Contact a health care provider if: °· You feel fewer than 10 movements in 2 hours. °· Your baby is not moving like he or she usually does. °Date: ____________ Start time: ____________ Stop time: ____________ Movements: ____________ °Date: ____________ Start time: ____________ Stop time: ____________ Movements: ____________ °Date: ____________ Start time: ____________ Stop time: ____________ Movements: ____________ °Date: ____________ Start time: ____________ Stop time: ____________ Movements: ____________ °Date: ____________ Start time: ____________ Stop time: ____________ Movements: ____________ °Date: ____________ Start time: ____________ Stop time: ____________ Movements: ____________ °Date: ____________ Start time: ____________ Stop time: ____________ Movements: ____________ °Date: ____________ Start time: ____________ Stop time: ____________ Movements: ____________ °Date: ____________ Start time: ____________ Stop time: ____________ Movements: ____________ °This information is not intended to replace advice given to you by your health care provider. Make sure you discuss any questions you have with your health care provider. °Document Revised: 09/21/2018 Document Reviewed: 09/21/2018 °Elsevier Patient Education © 2020 Elsevier Inc. ° °

## 2019-06-19 NOTE — MAU Provider Note (Signed)
Chief Complaint  Patient presents with  . Hypertension     First Provider Initiated Contact with Patient 06/19/19 1809      S: Connie Mccarty  is a 29 y.o. y.o. year old G1P0 female at [redacted]w[redacted]d weeks gestation who presents to MAU with elevated blood pressures. Hx of chronic hypertension. Current blood pressure medication: procardia xl 30 mg daily - last dose taken this morning.  Was in the office today for NST & her BP was above her baseline.   Associated symptoms: denies Headache, denies vision changes, denies epigastric pain Contractions: denies Vaginal bleeding: denies Fetal movement: good  O:  Patient Vitals for the past 24 hrs:  BP Temp Pulse Resp SpO2 Height Weight  06/19/19 1945 (!) 145/98 -- 95 -- -- -- --  06/19/19 1930 139/90 -- 91 -- -- -- --  06/19/19 1915 (!) 139/91 -- 97 -- -- -- --  06/19/19 1900 (!) 137/92 -- 96 -- 99 % -- --  06/19/19 1846 132/90 -- -- -- -- -- --  06/19/19 1841 (!) 157/104 -- 96 -- -- -- --  06/19/19 1831 (!) 135/95 -- 95 -- -- -- --  06/19/19 1815 (!) 154/111 -- 96 -- -- -- --  06/19/19 1800 (!) 153/99 -- -- -- -- -- --  06/19/19 1755 (!) 147/110 98.2 F (36.8 C) 98 16 99 % 4\' 11"  (1.499 m) 79.8 kg   General: NAD Heart: Regular rate Lungs: Normal rate and effort Abd: Soft, NT, Gravid, S=D Extremities: no Pedal edema Neuro: 2+ deep tendon reflexes, No clonus  NST:  Baseline: 150 bpm, Variability: Good {> 6 bpm), Accelerations: Reactive and Decelerations: Absent  Results for orders placed or performed during the hospital encounter of 06/19/19 (from the past 24 hour(s))  Comprehensive metabolic panel     Status: Abnormal   Collection Time: 06/19/19  6:30 PM  Result Value Ref Range   Sodium 137 135 - 145 mmol/L   Potassium 4.1 3.5 - 5.1 mmol/L   Chloride 107 98 - 111 mmol/L   CO2 18 (L) 22 - 32 mmol/L   Glucose, Bld 93 70 - 99 mg/dL   BUN 5 (L) 6 - 20 mg/dL   Creatinine, Ser 08/19/19 0.44 - 1.00 mg/dL   Calcium 9.3 8.9 - 1.32 mg/dL    Total Protein 6.8 6.5 - 8.1 g/dL   Albumin 2.9 (L) 3.5 - 5.0 g/dL   AST 19 15 - 41 U/L   ALT 15 0 - 44 U/L   Alkaline Phosphatase 120 38 - 126 U/L   Total Bilirubin 0.3 0.3 - 1.2 mg/dL   GFR calc non Af Amer >60 >60 mL/min   GFR calc Af Amer >60 >60 mL/min   Anion gap 12 5 - 15  CBC     Status: Abnormal   Collection Time: 06/19/19  6:30 PM  Result Value Ref Range   WBC 10.3 4.0 - 10.5 K/uL   RBC 4.36 3.87 - 5.11 MIL/uL   Hemoglobin 11.9 (L) 12.0 - 15.0 g/dL   HCT 08/19/19 10.2 - 72.5 %   MCV 85.3 80.0 - 100.0 fL   MCH 27.3 26.0 - 34.0 pg   MCHC 32.0 30.0 - 36.0 g/dL   RDW 36.6 (H) 44.0 - 34.7 %   Platelets 317 150 - 400 K/uL   nRBC 0.0 0.0 - 0.2 %  Urinalysis, Routine w reflex microscopic     Status: Abnormal   Collection Time: 06/19/19  6:55 PM  Result Value Ref  Range   Color, Urine STRAW (A) YELLOW   APPearance CLEAR CLEAR   Specific Gravity, Urine 1.004 (L) 1.005 - 1.030   pH 6.0 5.0 - 8.0   Glucose, UA NEGATIVE NEGATIVE mg/dL   Hgb urine dipstick MODERATE (A) NEGATIVE   Bilirubin Urine NEGATIVE NEGATIVE   Ketones, ur NEGATIVE NEGATIVE mg/dL   Protein, ur NEGATIVE NEGATIVE mg/dL   Nitrite NEGATIVE NEGATIVE   Leukocytes,Ua NEGATIVE NEGATIVE   RBC / HPF 6-10 0 - 5 RBC/hpf   Bacteria, UA NONE SEEN NONE SEEN   Squamous Epithelial / LPF 6-10 0 - 5  Protein / creatinine ratio, urine     Status: Abnormal   Collection Time: 06/19/19  6:55 PM  Result Value Ref Range   Creatinine, Urine 28.81 mg/dL   Total Protein, Urine 12 mg/dL   Protein Creatinine Ratio 0.42 (H) 0.00 - 0.15 mg/mg[Cre]    MDM Severe range BP x 2 with DBP of 110 & 111. Given 1 dose of IV labetalol 20 mg. No other severe range BPs & no severe features. PEC labs look ok but elevated protein creatinine ratio at 0.42.   Consulted with Dr. Harolyn Rutherford. Patient is stable & asymptomatic. Has close follow up in the office this Friday. Will increase her procardia to 60 mg daily.  Dr. Marvel Plan aware of plan & is  agreeable as well.  Reviewed strict return precautions related to preeclampsia with patient.   A:  1. Chronic hypertension with superimposed pre-eclampsia   2. [redacted] weeks gestation of pregnancy     P:  Discharge home in stable condition per consult with Drs. Anyanwu & Richardson Preeclampsia precautions. Increase procardia to 60 mg Return to maternity admissions as needed in emergencies  Jorje Guild, NP 06/19/2019 8:09 PM

## 2019-06-22 ENCOUNTER — Observation Stay (HOSPITAL_COMMUNITY)
Admission: AD | Admit: 2019-06-22 | Discharge: 2019-06-23 | Disposition: A | Discharge status: Home / Self Care | Payer: BC Managed Care – PPO | Attending: Obstetrics and Gynecology | Admitting: Obstetrics and Gynecology

## 2019-06-22 ENCOUNTER — Other Ambulatory Visit: Payer: Self-pay

## 2019-06-22 ENCOUNTER — Encounter (HOSPITAL_COMMUNITY): Payer: Self-pay | Admitting: Obstetrics and Gynecology

## 2019-06-22 DIAGNOSIS — O10919 Unspecified pre-existing hypertension complicating pregnancy, unspecified trimester: Secondary | ICD-10-CM | POA: Diagnosis present

## 2019-06-22 DIAGNOSIS — O10013 Pre-existing essential hypertension complicating pregnancy, third trimester: Principal | ICD-10-CM | POA: Insufficient documentation

## 2019-06-22 DIAGNOSIS — Z3A32 32 weeks gestation of pregnancy: Secondary | ICD-10-CM | POA: Diagnosis not present

## 2019-06-22 DIAGNOSIS — Z20822 Contact with and (suspected) exposure to covid-19: Secondary | ICD-10-CM | POA: Diagnosis not present

## 2019-06-22 DIAGNOSIS — Z79899 Other long term (current) drug therapy: Secondary | ICD-10-CM | POA: Insufficient documentation

## 2019-06-22 LAB — CBC
HCT: 38 % (ref 36.0–46.0)
Hemoglobin: 12.3 g/dL (ref 12.0–15.0)
MCH: 27.8 pg (ref 26.0–34.0)
MCHC: 32.4 g/dL (ref 30.0–36.0)
MCV: 85.8 fL (ref 80.0–100.0)
Platelets: 297 10*3/uL (ref 150–400)
RBC: 4.43 MIL/uL (ref 3.87–5.11)
RDW: 16.8 % — ABNORMAL HIGH (ref 11.5–15.5)
WBC: 9.3 10*3/uL (ref 4.0–10.5)
nRBC: 0 % (ref 0.0–0.2)

## 2019-06-22 LAB — COMPREHENSIVE METABOLIC PANEL
ALT: 15 U/L (ref 0–44)
AST: 20 U/L (ref 15–41)
Albumin: 2.9 g/dL — ABNORMAL LOW (ref 3.5–5.0)
Alkaline Phosphatase: 123 U/L (ref 38–126)
Anion gap: 11 (ref 5–15)
BUN: 7 mg/dL (ref 6–20)
CO2: 19 mmol/L — ABNORMAL LOW (ref 22–32)
Calcium: 9.7 mg/dL (ref 8.9–10.3)
Chloride: 107 mmol/L (ref 98–111)
Creatinine, Ser: 0.56 mg/dL (ref 0.44–1.00)
GFR calc Af Amer: >60 mL/min (ref >60)
GFR calc non Af Amer: >60 mL/min (ref >60)
Glucose, Bld: 86 mg/dL (ref 70–99)
Potassium: 4.2 mmol/L (ref 3.5–5.1)
Sodium: 137 mmol/L (ref 135–145)
Total Bilirubin: 0.3 mg/dL (ref 0.3–1.2)
Total Protein: 7 g/dL (ref 6.5–8.1)

## 2019-06-22 LAB — URINALYSIS, ROUTINE W REFLEX MICROSCOPIC
Bilirubin Urine: NEGATIVE
Glucose, UA: NEGATIVE mg/dL
Ketones, ur: NEGATIVE mg/dL
Leukocytes,Ua: NEGATIVE
Nitrite: NEGATIVE
Protein, ur: NEGATIVE mg/dL
Specific Gravity, Urine: 1.004 — ABNORMAL LOW (ref 1.005–1.030)
pH: 6 (ref 5.0–8.0)

## 2019-06-22 LAB — TYPE AND SCREEN
ABO/RH(D): O POS
Antibody Screen: NEGATIVE

## 2019-06-22 LAB — RESPIRATORY PANEL BY RT PCR (FLU A&B, COVID)
Influenza A by PCR: NEGATIVE
Influenza B by PCR: NEGATIVE
SARS Coronavirus 2 by RT PCR: NEGATIVE

## 2019-06-22 LAB — PROTEIN / CREATININE RATIO, URINE
Creatinine, Urine: 18.1 mg/dL
Protein Creatinine Ratio: 0.61 mg/mg{Cre} — ABNORMAL HIGH (ref 0.00–0.15)
Total Protein, Urine: 11 mg/dL

## 2019-06-22 LAB — ABO/RH: ABO/RH(D): O POS

## 2019-06-22 MED ORDER — CALCIUM CARBONATE ANTACID 500 MG PO CHEW: 2.0000 | CHEWABLE_TABLET | ORAL | Status: DC | PRN

## 2019-06-22 MED ORDER — BETAMETHASONE SOD PHOS & ACET 6 (3-3) MG/ML IJ SUSP
12.0000 mg | INTRAMUSCULAR | Status: AC
  Administered 2019-06-22 – 2019-06-23 (×2): 12 mg via INTRAMUSCULAR
  Filled 2019-06-22: qty 5

## 2019-06-22 MED ORDER — ZOLPIDEM TARTRATE 5 MG PO TABS: 5.0000 mg | ORAL_TABLET | Freq: Every evening | ORAL | Status: DC | PRN

## 2019-06-22 MED ORDER — ACETAMINOPHEN 325 MG PO TABS: 650.0000 mg | ORAL_TABLET | ORAL | Status: DC | PRN

## 2019-06-22 MED ORDER — DOCUSATE SODIUM 100 MG PO CAPS
100.0000 mg | ORAL_CAPSULE | Freq: Every day | ORAL | Status: DC
  Administered 2019-06-23: 100 mg via ORAL
  Filled 2019-06-22: qty 1

## 2019-06-22 MED ORDER — PRENATAL MULTIVITAMIN CH
1.0000 | ORAL_TABLET | Freq: Every day | ORAL | Status: DC
  Administered 2019-06-23: 1 via ORAL
  Filled 2019-06-22: qty 1

## 2019-06-22 MED ORDER — LABETALOL HCL 100 MG PO TABS
100.0000 mg | ORAL_TABLET | Freq: Two times a day (BID) | ORAL | Status: DC
  Administered 2019-06-22 – 2019-06-23 (×2): 100 mg via ORAL
  Filled 2019-06-22 (×2): qty 1

## 2019-06-22 MED ORDER — NIFEDIPINE ER OSMOTIC RELEASE 30 MG PO TB24
60.0000 mg | ORAL_TABLET | Freq: Every day | ORAL | Status: DC
  Administered 2019-06-23: 60 mg via ORAL
  Filled 2019-06-22: qty 2

## 2019-06-22 NOTE — MAU Note (Signed)
Pt sent from MD office for BP evaluation.  Denies H/A, visual disturbances, or epigastric pain.  Denies VB or LOF.  Endorses +FM.

## 2019-06-22 NOTE — Progress Notes (Signed)
Bps reviewed throughout admission. Will add on labetalol 100mg  BID at this time, continue to monitor. Patient with no complaints or new symptoms at this time.

## 2019-06-22 NOTE — H&P (Signed)
Connie Mccarty is a 30 y.o. female presenting for elevated BP in clinic, second episode. Is currently being treated for [facility] on medications being Procardia 60xl. Denies PreE symptoms today, +FM, denies VB, LOF, CTX. Took meds this morning before appointment; systolic BPs 160s-170s in office  Of note, patient was evaluated on 5/4 in MAU for similar complaint, had to receive IV labetalol 20mg  2/2 severe range BP, returned to baseline. Was discharged with an increase in BP meds from 30XL to 60XL at that time; was also asymptomatic. OB History    Gravida  1   Para      Term      Preterm      AB      Living        SAB      TAB      Ectopic      Multiple      Live Births             Past Medical History:  Diagnosis Date  . Hypertension    History reviewed. No pertinent surgical history. Family History: family history includes Diabetes in her father and mother. Social History:  reports that she has never smoked. She has never used smokeless tobacco. She reports that she does not drink alcohol or use drugs.     Maternal Diabetes: No Genetic Screening: Normal Maternal Ultrasounds/Referrals: Normal Fetal Ultrasounds or other Referrals:  None Maternal Substance Abuse:  No Significant Maternal Medications:  None Significant Maternal Lab Results:  None Other Comments:  None  Review of Systems  Constitutional: Negative for chills and fever.  Respiratory: Negative for shortness of breath.   Cardiovascular: Negative for chest pain, palpitations and leg swelling.  Gastrointestinal: Negative for abdominal pain and vomiting.  Neurological: Negative for dizziness, weakness and headaches.  Psychiatric/Behavioral: Negative for suicidal ideas.   History   Blood pressure (!) 152/93, pulse (!) 104, temperature 98.7 F (37.1 C), temperature source Oral, resp. rate 18, height 4\' 11"  (1.499 m), weight 79.2 kg, last menstrual period 11/04/2018, SpO2 99 %. Exam Physical Exam   Constitutional: She is oriented to person, place, and time. She appears well-developed and well-nourished. No distress.  HENT:  Head: Normocephalic and atraumatic.  Eyes: Pupils are equal, round, and reactive to light.  Cardiovascular: Normal rate and regular rhythm. Exam reveals no gallop.  No murmur heard. GI: There is no abdominal tenderness. There is no rebound and no guarding.  Genitourinary:    Vagina and uterus normal.   Musculoskeletal:        General: Normal range of motion.     Cervical back: Normal range of motion and neck supple.  Neurological: She is alert and oriented to person, place, and time.  Skin: Skin is warm and dry.    Prenatal labs: ABO, Rh: --/--/O POS, O POS Performed at [facility], [address], [place], [address] [place]  [phone]/[id number] 1244) Antibody: NEG (05/07 1244) Rubella:  Imm RPR:   nr HBsAg:   neg HIV:   nr GBS:   unk  Assessment/Plan: This is a 28yo G1P0 @ 32 6/7 by LMP c/w 14wk scan admitted to observation for BP monitoring and BMTZ course. CHTN on Nif60XL, recent elevations however patient remains asymptomatic. Cat 1 tracing, CE deferred  -EFM x107m q shift -regular diet -Continue Procardia 60XL QD -BMTZ start today -PreE labs: uPC 0.61, 12.3/38/297, AST/ALT 20/15, CR 0.56. Stable from 5/5 w/ exception of uPC (0.41) -NICU aware of admission. Only  plan on consulting if decision made to move forward with delivery.  Plan in-house until 2nd BMTZ dose.  [place] 06/22/2019, 6:32 PM

## 2019-06-23 LAB — RPR: RPR Ser Ql: NONREACTIVE

## 2019-06-23 MED ORDER — LABETALOL HCL 100 MG PO TABS: 100.0000 mg | ORAL_TABLET | Freq: Two times a day (BID) | ORAL | 1 refills | Status: DC

## 2019-06-23 MED ORDER — ASPIRIN 81 MG PO CHEW
81.0000 mg | CHEWABLE_TABLET | Freq: Every day | ORAL | Status: DC
  Administered 2019-06-23: 81 mg via ORAL
  Filled 2019-06-23: qty 1

## 2019-06-23 NOTE — Progress Notes (Addendum)
BP monitoring: 128-136/80-86 today. No further complaints. Reactive NST at 1000 today.   ADDENDUM 1700: No OB complaints, still denying PreE symptoms. Bps remain normotensive. Has clinic f/u on Tuesday. Strict PreE precautions given, will send additional Labetalol to pharmacy. All questions answered

## 2019-06-23 NOTE — Discharge Summary (Signed)
Physician Discharge Summary  Patient ID: Connie Mccarty MRN: [id number] DOB/AGE: 29/11/1990 29 y.o.  Admit date: 06/22/2019 Discharge date: 06/23/2019  Admission Diagnoses: Uncontrolled CHTN on meds  Discharge Diagnoses:  Active Problems:   Chronic hypertension affecting pregnancy   Discharged Condition: good  Hospital Course: Patient with known CHTN on Procardia 60XL sent from clinic for evaluation of severe range pressures. Second hospital evaluation within past week during which time meds were increased from 30XL. Given recent trend, decision made to admit to observation for blood pressure monitoring as well as administration of BMTZ course. Systolic Bps trended 140s-150, diastolic BPS 90s-100s; pt remained asymptomatic throughout admission. Labetalol 100mg BID added to regimen and pt remained normotensive from HD#1 evening onwards. Discharged home in stable condition with plans for f/u in clinic on 5/11. Strict PTL and PreE precautiosn given  Consults: None  Significant Diagnostic Studies: labs: uPC 0.61, 12.3/38/297, AST/ALT 20/15, CR 0.56.  Treatments: BMTZ 5/7-8  Discharge Exam: Blood pressure 137/90, pulse 87, temperature 97.7 F (36.5 C), temperature source Oral, resp. rate 18, height 4' 11" (1.499 m), weight 79.2 kg, last menstrual period 11/04/2018, SpO2 99 %.  Gen: Lying in bed, SCDs on, NAD CV. CTAB, RRR Abd: Gravid, S=D, NTTP MSK: neg calf edema/Homan's  Disposition: Discharge disposition: 01-Home or Self Care       Discharge Instructions    Discharge activity:   Complete by: As directed    Notify for severe HA, RUQ pain, LE edema, N/V, blurry vision, CP, SOB   Discharge diet:  No restrictions   Complete by: As directed    Fetal Kick Count:  Lie on our left side for one hour after a meal, and count the number of times your baby kicks.  If it is less than 5 times, get up, move around and drink some juice.  Repeat the test 30 minutes later.  If it is still  less than 5 kicks in an hour, notify your doctor.   Complete by: As directed    Notify physician for a general feeling that "something is not right"   Complete by: As directed    Notify physician for increase or change in vaginal discharge   Complete by: As directed    Notify physician for intestinal cramps, with or without diarrhea, sometimes described as "gas pain"   Complete by: As directed    Notify physician for leaking of fluid   Complete by: As directed    Notify physician for low, dull backache, unrelieved by heat or Tylenol   Complete by: As directed    Notify physician for menstrual like cramps   Complete by: As directed    Notify physician for pelvic pressure   Complete by: As directed    Notify physician for uterine contractions.  These may be painless and feel like the uterus is tightening or the baby is  "balling up"   Complete by: As directed    Notify physician for vaginal bleeding   Complete by: As directed    PRETERM LABOR:  Includes any of the follwing symptoms that occur between 20 - [redacted] weeks gestation.  If these symptoms are not stopped, preterm labor can result in preterm delivery, placing your baby at risk   Complete by: As directed      Allergies as of 06/23/2019   No Known Allergies     Medication List    TAKE these medications   aspirin 81 MG chewable tablet Chew by mouth daily.   Blood   Pressure Kit Use as directed   Blood Pressure Monitor/M Cuff Misc Use as directed   ferrous sulfate 324 MG Tbec Take 324 mg by mouth.   labetalol 100 MG tablet Commonly known as: NORMODYNE Take 1 tablet (100 mg total) by mouth 2 (two) times daily.   NIFEdipine 60 MG 24 hr tablet Commonly known as: PROCARDIA XL/NIFEDICAL XL Take 1 tablet (60 mg total) by mouth daily.   prenatal multivitamin Tabs tablet Take 1 tablet by mouth daily at 12 noon.        Signed: Shanti M Shivaji 06/23/2019, 5:15 PM  

## 2019-06-23 NOTE — Progress Notes (Signed)
AP NOTE  S:. Patient resting in bed. +FM, denies VB, LOF, CTX> Denies PreE symptoms. Feels like she slept well.  O: BP 128/80 (BP Location: Left Arm)   Pulse 92   Temp 97.8 F (36.6 C) (Oral)   Resp 18   Ht 4\' 11"  (1.499 m)   Wt 79.2 kg   LMP 11/04/2018   SpO2 100%   BMI 35.26 kg/m   Gen: Lying in bed, SCDs on, NAD CV. CTAB, RRR Abd: Gravid, S=D, NTTP MSK: neg calf edema/Homan's  PreE labs from admission uPC 0.61, 12.3/38/297, AST/ALT 20/15, CR 0.56.  Pending AM EFM  A/P: This is a 28yo G1P0 @ 33 0/7 by LMP c/w 14wk scan admitted to observation for BP monitoring and BMTZ course. CHTN on Nif60XL, recent elevations however patient remains asymptomatic.   CHTN on Rx: Labetalol 100mg  BID added yesterday evening. Overnight BP 128/84 (during sleep). Continue to monitor today on Nif 60XL/Lab 100mg  BID  IUP @ 33 0/7: No complaints or changes  Pla: Monitor Bps. Anticipate 2nd dose BMTZ around 1400, will DC after with close office follow-up

## 2019-07-03 ENCOUNTER — Other Ambulatory Visit: Payer: Self-pay

## 2019-07-03 ENCOUNTER — Encounter (HOSPITAL_COMMUNITY): Payer: Self-pay | Admitting: Obstetrics and Gynecology

## 2019-07-03 ENCOUNTER — Inpatient Hospital Stay (HOSPITAL_COMMUNITY)
Admission: AD | Admit: 2019-07-03 | Discharge: 2019-07-03 | Disposition: A | Discharge status: Home / Self Care | Payer: BC Managed Care – PPO | Attending: Obstetrics and Gynecology | Admitting: Obstetrics and Gynecology

## 2019-07-03 ENCOUNTER — Inpatient Hospital Stay (HOSPITAL_BASED_OUTPATIENT_CLINIC_OR_DEPARTMENT_OTHER): Payer: BC Managed Care – PPO

## 2019-07-03 DIAGNOSIS — O10013 Pre-existing essential hypertension complicating pregnancy, third trimester: Secondary | ICD-10-CM

## 2019-07-03 DIAGNOSIS — O10913 Unspecified pre-existing hypertension complicating pregnancy, third trimester: Secondary | ICD-10-CM | POA: Diagnosis present

## 2019-07-03 DIAGNOSIS — Z7982 Long term (current) use of aspirin: Secondary | ICD-10-CM | POA: Diagnosis not present

## 2019-07-03 DIAGNOSIS — O1213 Gestational proteinuria, third trimester: Secondary | ICD-10-CM | POA: Insufficient documentation

## 2019-07-03 DIAGNOSIS — Z3A34 34 weeks gestation of pregnancy: Secondary | ICD-10-CM | POA: Insufficient documentation

## 2019-07-03 DIAGNOSIS — Z79899 Other long term (current) drug therapy: Secondary | ICD-10-CM | POA: Insufficient documentation

## 2019-07-03 LAB — COMPREHENSIVE METABOLIC PANEL
ALT: 19 U/L (ref 0–44)
AST: 20 U/L (ref 15–41)
Albumin: 3 g/dL — ABNORMAL LOW (ref 3.5–5.0)
Alkaline Phosphatase: 148 U/L — ABNORMAL HIGH (ref 38–126)
Anion gap: 9 (ref 5–15)
BUN: 7 mg/dL (ref 6–20)
CO2: 23 mmol/L (ref 22–32)
Calcium: 9.6 mg/dL (ref 8.9–10.3)
Chloride: 106 mmol/L (ref 98–111)
Creatinine, Ser: 0.54 mg/dL (ref 0.44–1.00)
GFR calc Af Amer: >60 mL/min (ref >60)
GFR calc non Af Amer: >60 mL/min (ref >60)
Glucose, Bld: 117 mg/dL — ABNORMAL HIGH (ref 70–99)
Potassium: 4.3 mmol/L (ref 3.5–5.1)
Sodium: 138 mmol/L (ref 135–145)
Total Bilirubin: 0.3 mg/dL (ref 0.3–1.2)
Total Protein: 7.1 g/dL (ref 6.5–8.1)

## 2019-07-03 LAB — PROTEIN / CREATININE RATIO, URINE
Creatinine, Urine: 49.08 mg/dL
Protein Creatinine Ratio: 0.63 mg/mg{Cre} — ABNORMAL HIGH (ref 0.00–0.15)
Total Protein, Urine: 31 mg/dL

## 2019-07-03 LAB — CBC
HCT: 38.6 % (ref 36.0–46.0)
Hemoglobin: 12.2 g/dL (ref 12.0–15.0)
MCH: 27.7 pg (ref 26.0–34.0)
MCHC: 31.6 g/dL (ref 30.0–36.0)
MCV: 87.7 fL (ref 80.0–100.0)
Platelets: 277 10*3/uL (ref 150–400)
RBC: 4.4 MIL/uL (ref 3.87–5.11)
RDW: 17.8 % — ABNORMAL HIGH (ref 11.5–15.5)
WBC: 8.9 10*3/uL (ref 4.0–10.5)
nRBC: 0.2 % (ref 0.0–0.2)

## 2019-07-03 MED ORDER — LABETALOL HCL 5 MG/ML IV SOLN: 40.0000 mg | INTRAVENOUS | Status: DC | PRN

## 2019-07-03 MED ORDER — LABETALOL HCL 5 MG/ML IV SOLN: 20.0000 mg | INTRAVENOUS | Status: DC | PRN

## 2019-07-03 MED ORDER — HYDRALAZINE HCL 20 MG/ML IJ SOLN: 10.0000 mg | INTRAMUSCULAR | Status: DC | PRN

## 2019-07-03 MED ORDER — LABETALOL HCL 5 MG/ML IV SOLN: 80.0000 mg | INTRAVENOUS | Status: DC | PRN

## 2019-07-03 NOTE — MAU Note (Signed)
Sent over from office for [place], as they couldn't get it done there. Also for blood work.  BP was elevated.  Is on medication for BP.  Denies HA, visual changes, epigastric pain or swelling.

## 2019-07-03 NOTE — Discharge Instructions (Signed)
Preeclampsia and Eclampsia Preeclampsia is a serious condition that may develop during pregnancy. This condition causes high blood pressure and increased protein in your urine along with other symptoms, such as headaches and vision changes. These symptoms may develop as the condition gets worse. Preeclampsia may occur at 20 weeks of pregnancy or later. Diagnosing and treating preeclampsia early is very important. If not treated early, it can cause serious problems for you and your baby. One problem it can lead to is eclampsia. Eclampsia is a condition that causes muscle jerking or shaking (convulsions or seizures) and other serious problems for the mother. During pregnancy, delivering your baby may be the best treatment for preeclampsia or eclampsia. For most women, preeclampsia and eclampsia symptoms go away after giving birth. In rare cases, a woman may develop preeclampsia after giving birth (postpartum preeclampsia). This usually occurs within 48 hours after childbirth but may occur up to 6 weeks after giving birth. What are the causes? The cause of preeclampsia is not known. What increases the risk? The following risk factors make you more likely to develop preeclampsia:  Being pregnant for the first time.  Having had preeclampsia during a past pregnancy.  Having a family history of preeclampsia.  Having high blood pressure.  Being pregnant with more than one baby.  Being 35 or older.  Being African-American.  Having kidney disease or diabetes.  Having medical conditions such as lupus or blood diseases.  Being very overweight (obese). What are the signs or symptoms? The most common symptoms are:  Severe headaches.  Vision problems, such as blurred or double vision.  Abdominal pain, especially upper abdominal pain. Other symptoms that may develop as the condition gets worse include:  Sudden weight gain.  Sudden swelling of the hands, face, legs, and feet.  Severe nausea  and vomiting.  Numbness in the face, arms, legs, and feet.  Dizziness.  Urinating less than usual.  Slurred speech.  Convulsions or seizures. How is this diagnosed? There are no screening tests for preeclampsia. Your health care provider will ask you about symptoms and check for signs of preeclampsia during your prenatal visits. You may also have tests that include:  Checking your blood pressure.  Urine tests to check for protein. Your health care provider will check for this at every prenatal visit.  Blood tests.  Monitoring your baby's heart rate.  Ultrasound. How is this treated? You and your health care provider will determine the treatment approach that is best for you. Treatment may include:  Having more frequent prenatal exams to check for signs of preeclampsia, if you have an increased risk for preeclampsia.  Medicine to lower your blood pressure.  Staying in the hospital, if your condition is severe. There, treatment will focus on controlling your blood pressure and the amount of fluids in your body (fluid retention).  Taking medicine (magnesium sulfate) to prevent seizures. This may be given as an injection or through an IV.  Taking a low-dose aspirin during your pregnancy.  Delivering your baby early. You may have your labor started with medicine (induced), or you may have a cesarean delivery. Follow these instructions at home: Eating and drinking   Drink enough fluid to keep your urine pale yellow.  Avoid caffeine. Lifestyle  Do not use any products that contain nicotine or tobacco, such as cigarettes and e-cigarettes. If you need help quitting, ask your health care provider.  Do not use alcohol or drugs.  Avoid stress as much as possible. Rest and get   plenty of sleep. General instructions  Take over-the-counter and prescription medicines only as told by your health care provider.  When lying down, lie on your left side. This keeps pressure off your  major blood vessels.  When sitting or lying down, raise (elevate) your feet. Try putting some pillows underneath your lower legs.  Exercise regularly. Ask your health care provider what kinds of exercise are best for you.  Keep all follow-up and prenatal visits as told by your health care provider. This is important. How is this prevented? There is no known way of preventing preeclampsia or eclampsia from developing. However, to lower your risk of complications and detect problems early:  Get regular prenatal care. Your health care provider may be able to diagnose and treat the condition early.  Maintain a healthy weight. Ask your health care provider for help managing weight gain during pregnancy.  Work with your health care provider to manage any long-term (chronic) health conditions you have, such as diabetes or kidney problems.  You may have tests of your blood pressure and kidney function after giving birth.  Your health care provider may have you take low-dose aspirin during your next pregnancy. Contact a health care provider if:  You have symptoms that your health care provider told you may require more treatment or monitoring, such as: ? Headaches. ? Nausea or vomiting. ? Abdominal pain. ? Dizziness. ? Light-headedness. Get help right away if:  You have severe: ? Abdominal pain. ? Headaches that do not get better. ? Dizziness. ? Vision problems. ? Confusion. ? Nausea or vomiting.  You have any of the following: ? A seizure. ? Sudden, rapid weight gain. ? Sudden swelling in your hands, ankles, or face. ? Trouble moving any part of your body. ? Numbness in any part of your body. ? Trouble speaking. ? Abnormal bleeding.  You faint. Summary  Preeclampsia is a serious condition that may develop during pregnancy.  This condition causes high blood pressure and increased protein in your urine along with other symptoms, such as headaches and vision  changes.  Diagnosing and treating preeclampsia early is very important. If not treated early, it can cause serious problems for you and your baby.  Get help right away if you have symptoms that your health care provider told you to watch for. This information is not intended to replace advice given to you by your health care provider. Make sure you discuss any questions you have with your health care provider. Document Revised: 10/04/2017 Document Reviewed: 09/08/2015 Elsevier Patient Education  2020 [employer]  

## 2019-07-03 NOTE — MAU Provider Note (Signed)
History     CSN: [id number]  Arrival date and time: 07/03/19 1619   First Provider Initiated Contact with Patient 07/03/19 1657      Chief Complaint  Patient presents with  . Hypertension   Connie Mccarty is a 29 y.o. G1P0 at 19w3dwho presents today from the office. She was there today, and was scheudled for weekly antenatal testing. However this was not able to be completed in the office so she was sent here for BPP and NST. Patient has CHTN and is on 2 medications (labetalol and procardia) HTN is moderately controlled. She denies any HA, visual disturbances or RUQ pain. She reports normal fetal movement. She denies any contractions.    OB History    Gravida  1   Para      Term      Preterm      AB      Living        SAB      TAB      Ectopic      Multiple      Live Births              Past Medical History:  Diagnosis Date  . Hypertension     History reviewed. No pertinent surgical history.  Family History  Problem Relation Age of Onset  . Diabetes Father   . Diabetes Mother     Social History   Tobacco Use  . Smoking status: Never Smoker  . Smokeless tobacco: Never Used  Substance Use Topics  . Alcohol use: Never  . Drug use: Never    Allergies: No Known Allergies  Medications Prior to Admission  Medication Sig Dispense Refill Last Dose  . aspirin 81 MG chewable tablet Chew by mouth daily.   07/03/2019 at Unknown time  . Blood Pressure KIT Use as directed 1 kit 0 07/03/2019 at Unknown time  . Blood Pressure Monitoring (BLOOD PRESSURE MONITOR/M CUFF) MISC Use as directed 1 each 0 07/03/2019 at Unknown time  . ferrous sulfate 324 MG TBEC Take 324 mg by mouth.   07/03/2019 at Unknown time  . labetalol (NORMODYNE) 100 MG tablet Take 1 tablet (100 mg total) by mouth 2 (two) times daily. 60 tablet 1 07/03/2019 at Unknown time  . NIFEdipine (PROCARDIA XL/NIFEDICAL XL) 60 MG 24 hr tablet Take 1 tablet (60 mg total) by mouth daily. 30 tablet 0  07/03/2019 at Unknown time  . Prenatal Vit-Fe Fumarate-FA (PRENATAL MULTIVITAMIN) TABS tablet Take 1 tablet by mouth daily at 12 noon.   07/03/2019 at Unknown time    Review of Systems Physical Exam   Blood pressure (!) 157/105, pulse 92, temperature 97.8 F (36.6 C), temperature source Oral, resp. rate 18, height '4\' 11"'  (1.499 m), weight 76.7 kg, last menstrual period 11/04/2018, SpO2 100 %.  Physical Exam  Nursing note and vitals reviewed. Constitutional: She is oriented to person, place, and time. She appears well-developed and well-nourished. No distress.  HENT:  Head: Normocephalic.  Cardiovascular: Normal rate.  Respiratory: Effort normal.  GI: Soft. There is no abdominal tenderness. There is no rebound.  Musculoskeletal:        General: No edema.  Neurological: She is alert and oriented to person, place, and time. She has normal reflexes.  Skin: Skin is warm and dry.   NST:  Baseline: 150 Variability: moderate Accels: 15x15 Decels: none Toco: none Reactive   Results for orders placed or performed during the hospital encounter of 07/03/19 (from  the past 24 hour(s))  Comprehensive metabolic panel     Status: Abnormal   Collection Time: 07/03/19  5:04 PM  Result Value Ref Range   Sodium 138 135 - 145 mmol/L   Potassium 4.3 3.5 - 5.1 mmol/L   Chloride 106 98 - 111 mmol/L   CO2 23 22 - 32 mmol/L   Glucose, Bld 117 (H) 70 - 99 mg/dL   BUN 7 6 - 20 mg/dL   Creatinine, Ser 0.54 0.44 - 1.00 mg/dL   Calcium 9.6 8.9 - 10.3 mg/dL   Total Protein 7.1 6.5 - 8.1 g/dL   Albumin 3.0 (L) 3.5 - 5.0 g/dL   AST 20 15 - 41 U/L   ALT 19 0 - 44 U/L   Alkaline Phosphatase 148 (H) 38 - 126 U/L   Total Bilirubin 0.3 0.3 - 1.2 mg/dL   GFR calc non Af Amer >60 >60 mL/min   GFR calc Af Amer >60 >60 mL/min   Anion gap 9 5 - 15  CBC     Status: Abnormal   Collection Time: 07/03/19  5:04 PM  Result Value Ref Range   WBC 8.9 4.0 - 10.5 K/uL   RBC 4.40 3.87 - 5.11 MIL/uL   Hemoglobin 12.2  12.0 - 15.0 g/dL   HCT 38.6 36.0 - 46.0 %   MCV 87.7 80.0 - 100.0 fL   MCH 27.7 26.0 - 34.0 pg   MCHC 31.6 30.0 - 36.0 g/dL   RDW 17.8 (H) 11.5 - 15.5 %   Platelets 277 150 - 400 K/uL   nRBC 0.2 0.0 - 0.2 %  Protein / creatinine ratio, urine     Status: Abnormal   Collection Time: 07/03/19  5:09 PM  Result Value Ref Range   Creatinine, Urine 49.08 mg/dL   Total Protein, Urine 31 mg/dL   Protein Creatinine Ratio 0.63 (H) 0.00 - 0.15 mg/mg[Cre]   Patient Vitals for the past 24 hrs:  BP Temp Temp src Pulse Resp SpO2 Height Weight  07/03/19 1900 128/82 -- -- 98 -- -- -- --  07/03/19 1845 (!) 129/92 -- -- 98 -- -- -- --  07/03/19 1830 123/89 -- -- 95 -- -- -- --  07/03/19 1815 (!) 135/93 -- -- 89 -- -- -- --  07/03/19 1812 (!) 141/99 -- -- 88 -- -- -- --  07/03/19 1745 (!) 140/92 -- -- 89 -- -- -- --  07/03/19 1730 (!) 136/94 -- -- 94 -- 97 % -- --  07/03/19 1725 -- -- -- -- -- 96 % -- --  07/03/19 1720 -- -- -- -- -- 99 % -- --  07/03/19 1715 (!) 135/92 -- -- 89 -- 97 % -- --  07/03/19 1710 -- -- -- -- -- 97 % -- --  07/03/19 1705 -- -- -- -- -- 98 % -- --  07/03/19 1700 133/88 -- -- 90 -- 98 % -- --  07/03/19 1655 -- -- -- -- -- 98 % -- --  07/03/19 1650 -- -- -- -- -- 97 % -- --  07/03/19 1646 (!) 135/92 -- -- 93 -- -- -- --  07/03/19 1645 -- -- -- -- -- 98 % -- --  07/03/19 1628 (!) 157/105 97.8 F (36.6 C) Oral 92 18 100 % '4\' 11"'  (1.499 m) 76.7 kg     MAU Course  Procedures  MDM BPP 8/8 NST reactive = 10/10   6:28 PM DW Dr. Nehemiah Settle, he recommends discussing with Dr. Terri Piedra at this  time to review results.   7:14 PM Dr. Terri Piedra on the unit and has reviewed patient's chart and labs from the office. Patient ok for DC home at this time. She has an appt on 07/06/19 in the office. They will follow up there. No change to medications at this time.   Assessment and Plan   1. Chronic hypertension with exacerbation during pregnancy in third trimester   2. [redacted] weeks gestation of  pregnancy   3. Gestational proteinuria in third trimester    DC home Comfort measures reviewed  3rd Trimester precautions  Pre-eclampsia/eclampsia warning signs  PTL precautions  Fetal kick counts RX: no new rx provided  Return to MAU as needed FU with OB as planned    [place], CNM  07/03/19  5:29 PM

## 2019-07-03 NOTE — MAU Provider Note (Signed)
Pt denies HA, blurry vision or malaise.  BP was elevated on initial arrival to MAU but decreased with no intervention while pt being monitored. At time of my exam BP is 129/92 BPP - 8/8 NST - cat 1  Had discussion with pt about preE. Explained concern given worsening proteinuria and labile BPs. Reassuring aspect of pt being asymptomatic and nl LFTs appreciated.  Explained to pt if BP requires medication intervention or if develops HA or blurry vision will be indication for delivery. Pt agrees Pt to keep appt in office for NST and with Dr. Jackelyn Knife on 07/05/19

## 2019-07-10 ENCOUNTER — Inpatient Hospital Stay (HOSPITAL_BASED_OUTPATIENT_CLINIC_OR_DEPARTMENT_OTHER): Payer: BC Managed Care – PPO

## 2019-07-10 ENCOUNTER — Other Ambulatory Visit: Payer: Self-pay

## 2019-07-10 ENCOUNTER — Encounter (HOSPITAL_COMMUNITY): Payer: Self-pay | Admitting: Obstetrics and Gynecology

## 2019-07-10 ENCOUNTER — Inpatient Hospital Stay (HOSPITAL_COMMUNITY)
Admission: AD | Admit: 2019-07-10 | Discharge: 2019-07-10 | Disposition: A | Discharge status: Home / Self Care | Payer: BC Managed Care – PPO | Attending: Obstetrics and Gynecology | Admitting: Obstetrics and Gynecology

## 2019-07-10 DIAGNOSIS — Z3689 Encounter for other specified antenatal screening: Secondary | ICD-10-CM | POA: Diagnosis not present

## 2019-07-10 DIAGNOSIS — O10013 Pre-existing essential hypertension complicating pregnancy, third trimester: Secondary | ICD-10-CM | POA: Insufficient documentation

## 2019-07-10 DIAGNOSIS — Z3A35 35 weeks gestation of pregnancy: Secondary | ICD-10-CM

## 2019-07-10 DIAGNOSIS — Z79899 Other long term (current) drug therapy: Secondary | ICD-10-CM | POA: Insufficient documentation

## 2019-07-10 DIAGNOSIS — O368330 Maternal care for abnormalities of the fetal heart rate or rhythm, third trimester, not applicable or unspecified: Secondary | ICD-10-CM | POA: Insufficient documentation

## 2019-07-10 DIAGNOSIS — O10919 Unspecified pre-existing hypertension complicating pregnancy, unspecified trimester: Secondary | ICD-10-CM

## 2019-07-10 DIAGNOSIS — O10913 Unspecified pre-existing hypertension complicating pregnancy, third trimester: Secondary | ICD-10-CM | POA: Diagnosis not present

## 2019-07-10 DIAGNOSIS — O288 Other abnormal findings on antenatal screening of mother: Secondary | ICD-10-CM | POA: Diagnosis not present

## 2019-07-10 DIAGNOSIS — Z7982 Long term (current) use of aspirin: Secondary | ICD-10-CM | POA: Diagnosis not present

## 2019-07-10 LAB — COMPREHENSIVE METABOLIC PANEL
ALT: 17 U/L (ref 0–44)
AST: 18 U/L (ref 15–41)
Albumin: 2.8 g/dL — ABNORMAL LOW (ref 3.5–5.0)
Alkaline Phosphatase: 158 U/L — ABNORMAL HIGH (ref 38–126)
Anion gap: 9 (ref 5–15)
BUN: 7 mg/dL (ref 6–20)
CO2: 21 mmol/L — ABNORMAL LOW (ref 22–32)
Calcium: 9.3 mg/dL (ref 8.9–10.3)
Chloride: 106 mmol/L (ref 98–111)
Creatinine, Ser: 0.57 mg/dL (ref 0.44–1.00)
GFR calc Af Amer: >60 mL/min (ref >60)
GFR calc non Af Amer: >60 mL/min (ref >60)
Glucose, Bld: 116 mg/dL — ABNORMAL HIGH (ref 70–99)
Potassium: 4.3 mmol/L (ref 3.5–5.1)
Sodium: 136 mmol/L (ref 135–145)
Total Bilirubin: 0.3 mg/dL (ref 0.3–1.2)
Total Protein: 6.7 g/dL (ref 6.5–8.1)

## 2019-07-10 LAB — URINALYSIS, ROUTINE W REFLEX MICROSCOPIC
Bacteria, UA: NONE SEEN
Bilirubin Urine: NEGATIVE
Glucose, UA: NEGATIVE mg/dL
Ketones, ur: NEGATIVE mg/dL
Leukocytes,Ua: NEGATIVE
Nitrite: NEGATIVE
Protein, ur: NEGATIVE mg/dL
Specific Gravity, Urine: 1.006 (ref 1.005–1.030)
pH: 6 (ref 5.0–8.0)

## 2019-07-10 LAB — PROTEIN / CREATININE RATIO, URINE
Creatinine, Urine: 23.61 mg/dL
Protein Creatinine Ratio: 0.51 mg/mg{Cre} — ABNORMAL HIGH (ref 0.00–0.15)
Total Protein, Urine: 12 mg/dL

## 2019-07-10 LAB — CBC
HCT: 38.2 % (ref 36.0–46.0)
Hemoglobin: 12.2 g/dL (ref 12.0–15.0)
MCH: 27.8 pg (ref 26.0–34.0)
MCHC: 31.9 g/dL (ref 30.0–36.0)
MCV: 87 fL (ref 80.0–100.0)
Platelets: 242 10*3/uL (ref 150–400)
RBC: 4.39 MIL/uL (ref 3.87–5.11)
RDW: 18.3 % — ABNORMAL HIGH (ref 11.5–15.5)
WBC: 8.9 10*3/uL (ref 4.0–10.5)
nRBC: 0 % (ref 0.0–0.2)

## 2019-07-10 NOTE — MAU Provider Note (Signed)
History     CSN: [id number]  Arrival date and time: 07/10/19 1715   First Provider Initiated Contact with Patient 07/10/19 1811      Chief Complaint  Patient presents with  . Hypertension   29 y.o. G1 _0 .3 wks presenting from office for [place]. Reports good FM. No VB, LOF, or ctx. 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 She took both meds today. Denies HA, visual disturbances, RUQ pain, SOB, and CP.   OB History    Gravida  1   Para      Term      Preterm      AB      Living        SAB      TAB      Ectopic      Multiple      Live Births              Past Medical History:  Diagnosis Date  . Hypertension     History reviewed. No pertinent surgical history.  Family History  Problem Relation Age of Onset  . Diabetes Father   . Diabetes Mother     Social History   Tobacco Use  . Smoking status: Never Smoker  . Smokeless tobacco: Never Used  Substance Use Topics  . Alcohol use: Never  . Drug use: Never    Allergies: No Known Allergies  No medications prior to admission.    Review of Systems  Respiratory: Negative for shortness of breath.   Cardiovascular: Negative for chest pain.  Gastrointestinal: Negative for abdominal pain.  Genitourinary: Negative for vaginal bleeding and vaginal discharge.  Neurological: Negative for headaches.   Physical Exam   Blood pressure 118/84, pulse 99, temperature 98.2 F (36.8 C), temperature source Oral, resp. rate 18, height _1  (1.499 m), weight 78.7 kg, last menstrual period 11/04/2018, SpO2 100 %. Patient Vitals for the past 24 hrs:  BP Temp Temp src Pulse Resp SpO2 Height Weight  07/10/19 1925 -- -- Oral -- 18 -- -- --  07/10/19 1916 118/84 -- -- 99 -- -- -- --  07/10/19 1902 (!) 134/92 -- -- 93 -- -- -- --  07/10/19 1855 (!) 141/95 -- -- 88 -- -- -- --  07/10/19 1830 127/88 -- -- 87 -- -- -- --  07/10/19 1815 (!) 146/91 -- -- 93 -- -- -- --  07/10/19 1804 (!)  137/95 -- -- 92 -- -- -- --  07/10/19 1744 (!) 154/100 98.2 F (36.8 C) Oral 94 18 100 % _2  (1.499 m) 78.7 kg   Physical Exam  Nursing note and vitals reviewed. Constitutional: She is oriented to person, place, and time. She appears well-developed and well-nourished. No distress.  HENT:  Head: Normocephalic and atraumatic.  Cardiovascular: Normal rate.  Respiratory: Effort normal. No respiratory distress.  Musculoskeletal:        General: Normal range of motion.     Cervical back: Normal range of motion.  Neurological: She is alert and oriented to person, place, and time.  Psychiatric: She has a normal mood and affect.  EFM: 150 bpm, mod variability, + accels, no decels Toco: rare  Results for orders placed or performed during the hospital encounter of 07/10/19 (from the past 24 hour(s))  Protein / creatinine ratio, urine     Status: Abnormal   Collection Time: 07/10/19  5:50 PM  Result Value Ref Range   Creatinine, Urine 23.61  mg/dL   Total Protein, Urine 12 mg/dL   Protein Creatinine Ratio 0.51 (H) 0.00 - 0.15 mg/mg[Cre]  Urinalysis, Routine w reflex microscopic     Status: Abnormal   Collection Time: 07/10/19  5:53 PM  Result Value Ref Range   Color, Urine STRAW (A) YELLOW   APPearance CLEAR CLEAR   Specific Gravity, Urine 1.006 1.005 - 1.030   pH 6.0 5.0 - 8.0   Glucose, UA NEGATIVE NEGATIVE mg/dL   Hgb urine dipstick SMALL (A) NEGATIVE   Bilirubin Urine NEGATIVE NEGATIVE   Ketones, ur NEGATIVE NEGATIVE mg/dL   Protein, ur NEGATIVE NEGATIVE mg/dL   Nitrite NEGATIVE NEGATIVE   Leukocytes,Ua NEGATIVE NEGATIVE   RBC / HPF 0-5 0 - 5 RBC/hpf   WBC, UA 0-5 0 - 5 WBC/hpf   Bacteria, UA NONE SEEN NONE SEEN   Squamous Epithelial / LPF 0-5 0 - 5   Mucus PRESENT   CBC     Status: Abnormal   Collection Time: 07/10/19  6:05 PM  Result Value Ref Range   WBC 8.9 4.0 - 10.5 K/uL   RBC 4.39 3.87 - 5.11 MIL/uL   Hemoglobin 12.2 12.0 - 15.0 g/dL   HCT 38.2 36.0 - 46.0 %   MCV  87.0 80.0 - 100.0 fL   MCH 27.8 26.0 - 34.0 pg   MCHC 31.9 30.0 - 36.0 g/dL   RDW 18.3 (H) 11.5 - 15.5 %   Platelets 242 150 - 400 K/uL   nRBC 0.0 0.0 - 0.2 %  Comprehensive metabolic panel     Status: Abnormal   Collection Time: 07/10/19  6:05 PM  Result Value Ref Range   Sodium 136 135 - 145 mmol/L   Potassium 4.3 3.5 - 5.1 mmol/L   Chloride 106 98 - 111 mmol/L   CO2 21 (L) 22 - 32 mmol/L   Glucose, Bld 116 (H) 70 - 99 mg/dL   BUN 7 6 - 20 mg/dL   Creatinine, Ser 0.57 0.44 - 1.00 mg/dL   Calcium 9.3 8.9 - 10.3 mg/dL   Total Protein 6.7 6.5 - 8.1 g/dL   Albumin 2.8 (L) 3.5 - 5.0 g/dL   AST 18 15 - 41 U/L   ALT 17 0 - 44 U/L   Alkaline Phosphatase 158 (H) 38 - 126 U/L   Total Bilirubin 0.3 0.3 - 1.2 mg/dL   GFR calc non Af Amer >60 >60 mL/min   GFR calc Af Amer >60 >60 mL/min   Anion gap 9 5 - 15   MAU Course  Procedures  MDM Labs and BPP ordered. No severe features. BPP 8/8>10/10. Stable for discharge home. Dr. Wilhelmenia Blase on unit and aware of findings and plan.  Assessment and Plan  [redacted] weeks gestation Chronic HTN in pregnancy Discharge home Follow up at [facility] this week as scheduled Return precautions  Allergies as of 07/10/2019   No Known Allergies     Medication List    TAKE these medications   aspirin 81 MG chewable tablet Chew by mouth daily.   Blood Pressure Kit Use as directed   Blood Pressure Monitor/M Cuff Misc Use as directed   ferrous sulfate 324 MG Tbec Take 324 mg by mouth.   labetalol 100 MG tablet Commonly known as: NORMODYNE Take 1 tablet (100 mg total) by mouth 2 (two) times daily.   NIFEdipine 60 MG 24 hr tablet Commonly known as: PROCARDIA XL/NIFEDICAL XL Take 1 tablet (60 mg total) by mouth daily.   prenatal  multivitamin Tabs tablet Take 1 tablet by mouth daily at 12 noon.      Connie Mccarty, CNM 07/10/2019, 7:31 PM

## 2019-07-10 NOTE — Discharge Instructions (Signed)

## 2019-07-10 NOTE — MAU Note (Signed)
Sent if for [place] and BP check. Is on BP medication. Denies HA, visual changes, epigastric pain  Or increase in swelling.

## 2019-07-17 ENCOUNTER — Telehealth (HOSPITAL_COMMUNITY): Payer: Self-pay | Admitting: *Deleted

## 2019-07-17 NOTE — Telephone Encounter (Signed)
Preadmission screen  

## 2019-07-18 ENCOUNTER — Telehealth (HOSPITAL_COMMUNITY): Payer: Self-pay | Admitting: *Deleted

## 2019-07-18 NOTE — Telephone Encounter (Signed)
Preadmission screen  

## 2019-07-19 ENCOUNTER — Encounter (HOSPITAL_COMMUNITY): Payer: Self-pay | Admitting: *Deleted

## 2019-07-19 ENCOUNTER — Telehealth (HOSPITAL_COMMUNITY): Payer: Self-pay | Admitting: *Deleted

## 2019-07-19 NOTE — Telephone Encounter (Signed)
Preadmission screen  

## 2019-07-21 ENCOUNTER — Other Ambulatory Visit: Payer: Self-pay | Admitting: Obstetrics and Gynecology

## 2019-07-21 ENCOUNTER — Other Ambulatory Visit (HOSPITAL_COMMUNITY)
Admission: RE | Admit: 2019-07-21 | Discharge: 2019-07-21 | Disposition: A | Discharge status: Home / Self Care | Payer: BC Managed Care – PPO | Source: Ambulatory Visit | Attending: Obstetrics and Gynecology | Admitting: Obstetrics and Gynecology

## 2019-07-21 DIAGNOSIS — Z01812 Encounter for preprocedural laboratory examination: Secondary | ICD-10-CM | POA: Insufficient documentation

## 2019-07-21 DIAGNOSIS — Z20822 Contact with and (suspected) exposure to covid-19: Secondary | ICD-10-CM | POA: Insufficient documentation

## 2019-07-21 LAB — SARS CORONAVIRUS 2 (TAT 6-24 HRS): SARS Coronavirus 2: NEGATIVE

## 2019-07-23 ENCOUNTER — Other Ambulatory Visit: Payer: Self-pay

## 2019-07-23 ENCOUNTER — Encounter (HOSPITAL_COMMUNITY): Payer: Self-pay | Admitting: Obstetrics and Gynecology

## 2019-07-23 ENCOUNTER — Inpatient Hospital Stay (HOSPITAL_COMMUNITY): Payer: BC Managed Care – PPO

## 2019-07-23 ENCOUNTER — Inpatient Hospital Stay (HOSPITAL_COMMUNITY)
Admission: AD | Admit: 2019-07-23 | Discharge: 2019-07-27 | DRG: 786 | Disposition: A | Discharge status: Home / Self Care | Payer: BC Managed Care – PPO | Attending: Obstetrics and Gynecology | Admitting: Obstetrics and Gynecology

## 2019-07-23 DIAGNOSIS — Z20822 Contact with and (suspected) exposure to covid-19: Secondary | ICD-10-CM | POA: Diagnosis present

## 2019-07-23 DIAGNOSIS — O99214 Obesity complicating childbirth: Secondary | ICD-10-CM | POA: Diagnosis present

## 2019-07-23 DIAGNOSIS — O114 Pre-existing hypertension with pre-eclampsia, complicating childbirth: Principal | ICD-10-CM | POA: Diagnosis present

## 2019-07-23 DIAGNOSIS — O1002 Pre-existing essential hypertension complicating childbirth: Secondary | ICD-10-CM | POA: Diagnosis present

## 2019-07-23 DIAGNOSIS — O411230 Chorioamnionitis, third trimester, not applicable or unspecified: Secondary | ICD-10-CM | POA: Diagnosis present

## 2019-07-23 DIAGNOSIS — Z7982 Long term (current) use of aspirin: Secondary | ICD-10-CM | POA: Diagnosis not present

## 2019-07-23 DIAGNOSIS — Z98891 History of uterine scar from previous surgery: Secondary | ICD-10-CM

## 2019-07-23 DIAGNOSIS — Z3A37 37 weeks gestation of pregnancy: Secondary | ICD-10-CM

## 2019-07-23 DIAGNOSIS — E669 Obesity, unspecified: Secondary | ICD-10-CM | POA: Diagnosis present

## 2019-07-23 DIAGNOSIS — O10913 Unspecified pre-existing hypertension complicating pregnancy, third trimester: Secondary | ICD-10-CM | POA: Diagnosis present

## 2019-07-23 LAB — COMPREHENSIVE METABOLIC PANEL
ALT: 17 U/L (ref 0–44)
AST: 21 U/L (ref 15–41)
Albumin: 2.7 g/dL — ABNORMAL LOW (ref 3.5–5.0)
Alkaline Phosphatase: 193 U/L — ABNORMAL HIGH (ref 38–126)
Anion gap: 11 (ref 5–15)
BUN: 10 mg/dL (ref 6–20)
CO2: 18 mmol/L — ABNORMAL LOW (ref 22–32)
Calcium: 9.2 mg/dL (ref 8.9–10.3)
Chloride: 107 mmol/L (ref 98–111)
Creatinine, Ser: 0.58 mg/dL (ref 0.44–1.00)
GFR calc Af Amer: >60 mL/min (ref >60)
GFR calc non Af Amer: >60 mL/min (ref >60)
Glucose, Bld: 151 mg/dL — ABNORMAL HIGH (ref 70–99)
Potassium: 4 mmol/L (ref 3.5–5.1)
Sodium: 136 mmol/L (ref 135–145)
Total Bilirubin: 0.1 mg/dL — ABNORMAL LOW (ref 0.3–1.2)
Total Protein: 6.6 g/dL (ref 6.5–8.1)

## 2019-07-23 LAB — CBC
HCT: 38.1 % (ref 36.0–46.0)
Hemoglobin: 12.4 g/dL (ref 12.0–15.0)
MCH: 28.9 pg (ref 26.0–34.0)
MCHC: 32.5 g/dL (ref 30.0–36.0)
MCV: 88.8 fL (ref 80.0–100.0)
Platelets: 262 10*3/uL (ref 150–400)
RBC: 4.29 MIL/uL (ref 3.87–5.11)
RDW: 19 % — ABNORMAL HIGH (ref 11.5–15.5)
WBC: 10.4 10*3/uL (ref 4.0–10.5)
nRBC: 0.2 % (ref 0.0–0.2)

## 2019-07-23 LAB — TYPE AND SCREEN
ABO/RH(D): O POS
Antibody Screen: NEGATIVE

## 2019-07-23 LAB — RPR: RPR Ser Ql: NONREACTIVE

## 2019-07-23 MED ORDER — OXYTOCIN-SODIUM CHLORIDE 30-0.9 UT/500ML-% IV SOLN: 2.5000 [IU]/h | INTRAVENOUS | Status: DC

## 2019-07-23 MED ORDER — HYDRALAZINE HCL 20 MG/ML IJ SOLN
10.0000 mg | INTRAMUSCULAR | Status: DC | PRN
Start: 2019-07-23 — End: 2019-07-23

## 2019-07-23 MED ORDER — LACTATED RINGERS IV SOLN: INTRAVENOUS | Status: DC

## 2019-07-23 MED ORDER — OXYCODONE-ACETAMINOPHEN 5-325 MG PO TABS: 1.0000 | ORAL_TABLET | ORAL | Status: DC | PRN

## 2019-07-23 MED ORDER — LIDOCAINE HCL (PF) 1 % IJ SOLN: 30.0000 mL | INTRAMUSCULAR | Status: DC | PRN

## 2019-07-23 MED ORDER — OXYTOCIN-SODIUM CHLORIDE 30-0.9 UT/500ML-% IV SOLN
1.0000 m[IU]/min | INTRAVENOUS | Status: DC
  Administered 2019-07-23: 2 m[IU]/min via INTRAVENOUS
  Filled 2019-07-23: qty 500

## 2019-07-23 MED ORDER — TERBUTALINE SULFATE 1 MG/ML IJ SOLN: 0.2500 mg | Freq: Once | INTRAMUSCULAR | Status: DC | PRN

## 2019-07-23 MED ORDER — LABETALOL HCL 5 MG/ML IV SOLN
40.0000 mg | INTRAVENOUS | Status: DC | PRN
  Administered 2019-07-23: 40 mg via INTRAVENOUS
  Filled 2019-07-23: qty 8

## 2019-07-23 MED ORDER — SOD CITRATE-CITRIC ACID 500-334 MG/5ML PO SOLN
30.0000 mL | ORAL | Status: DC | PRN
  Filled 2019-07-23: qty 30

## 2019-07-23 MED ORDER — MISOPROSTOL 25 MCG QUARTER TABLET
25.0000 ug | ORAL_TABLET | ORAL | Status: DC | PRN
  Administered 2019-07-23 (×4): 25 ug via VAGINAL
  Filled 2019-07-23 (×4): qty 1

## 2019-07-23 MED ORDER — BUTORPHANOL TARTRATE 1 MG/ML IJ SOLN
1.0000 mg | INTRAMUSCULAR | Status: DC | PRN
  Administered 2019-07-23 (×2): 1 mg via INTRAVENOUS
  Filled 2019-07-23 (×2): qty 1

## 2019-07-23 MED ORDER — LABETALOL HCL 5 MG/ML IV SOLN: 40.0000 mg | INTRAVENOUS | Status: DC | PRN

## 2019-07-23 MED ORDER — HYDRALAZINE HCL 20 MG/ML IJ SOLN
10.0000 mg | INTRAMUSCULAR | Status: DC | PRN
  Administered 2019-07-23: 10 mg via INTRAVENOUS
  Filled 2019-07-23: qty 1

## 2019-07-23 MED ORDER — LABETALOL HCL 5 MG/ML IV SOLN
20.0000 mg | INTRAVENOUS | Status: DC | PRN
Start: 2019-07-23 — End: 2019-07-23

## 2019-07-23 MED ORDER — LACTATED RINGERS IV SOLN
500.0000 mL | INTRAVENOUS | Status: DC | PRN
  Administered 2019-07-24: 1000 mL via INTRAVENOUS

## 2019-07-23 MED ORDER — LABETALOL HCL 5 MG/ML IV SOLN
80.0000 mg | INTRAVENOUS | Status: DC | PRN
Start: 2019-07-23 — End: 2019-07-23

## 2019-07-23 MED ORDER — LABETALOL HCL 5 MG/ML IV SOLN
80.0000 mg | INTRAVENOUS | Status: DC | PRN
  Administered 2019-07-23: 80 mg via INTRAVENOUS
  Filled 2019-07-23: qty 16

## 2019-07-23 MED ORDER — NIFEDIPINE ER OSMOTIC RELEASE 60 MG PO TB24
60.0000 mg | ORAL_TABLET | Freq: Every day | ORAL | Status: DC
  Administered 2019-07-23 – 2019-07-27 (×5): 60 mg via ORAL
  Filled 2019-07-23 (×8): qty 1

## 2019-07-23 MED ORDER — OXYCODONE-ACETAMINOPHEN 5-325 MG PO TABS: 2.0000 | ORAL_TABLET | ORAL | Status: DC | PRN

## 2019-07-23 MED ORDER — ACETAMINOPHEN 325 MG PO TABS: 650.0000 mg | ORAL_TABLET | ORAL | Status: DC | PRN

## 2019-07-23 MED ORDER — ONDANSETRON HCL 4 MG/2ML IJ SOLN: 4.0000 mg | Freq: Four times a day (QID) | INTRAMUSCULAR | Status: DC | PRN

## 2019-07-23 MED ORDER — LABETALOL HCL 5 MG/ML IV SOLN
20.0000 mg | INTRAVENOUS | Status: DC | PRN
  Administered 2019-07-23: 20 mg via INTRAVENOUS
  Filled 2019-07-23: qty 4

## 2019-07-23 MED ORDER — OXYTOCIN BOLUS FROM INFUSION: 500.0000 mL | Freq: Once | INTRAVENOUS | Status: DC

## 2019-07-23 NOTE — Progress Notes (Signed)
Feeling some ctx.  Foley came out just prior to 1800, on pitocin Afeb, VSS, BP stable FHT-140s, mod variability, some variable decels, Cat II, irreg ctx VE-5-6/50/-3, vtx, AROM clear while pushing vtx onto cervix Will continue pitocin, monitor BP and progress

## 2019-07-23 NOTE — H&P (Signed)
Connie Mccarty is a 29 y.o. female, G1P0, EGA 37+ weeks with EDC 6-26 presenting for induction for CHTN with possible superimposed preeclampsia.  She has been followed for [facility] during the pregnancy, initially treated with Procardia XL 60 mg daily.  Her BP has been elevated when seen in the office for the last 2 months, she says normal at home.  She has been evaluated in MAU several times as well.  Labetalol 100 mg bid was added at one visit to MAU.  Baseline UPC 0.13, has risen to 0.61, other labs remain normal and she has no PIH sx.  She received steroid early may when admitted for extended BP monitoring.  Prenatal care otherwise uncomplicated.  OB History    Gravida  1   Para      Term      Preterm      AB      Living        SAB      TAB      Ectopic      Multiple      Live Births             Past Medical History:  Diagnosis Date  . Hypertension   . Pregnancy induced hypertension    Past Surgical History:  Procedure Laterality Date  . NO PAST SURGERIES     Family History: family history includes Diabetes in her father and mother; Heart disease in her father. Social History:  reports that she has never smoked. She has never used smokeless tobacco. She reports that she does not drink alcohol or use drugs.     Maternal Diabetes: No Genetic Screening: Normal Maternal Ultrasounds/Referrals: Normal Fetal Ultrasounds or other Referrals:  None Maternal Substance Abuse:  No Significant Maternal Medications:  Meds include: Other:  Significant Maternal Lab Results:  Group B Strep negative Other Comments:  Procardia XL and Labetalol for BP  Review of Systems  Respiratory: Negative.   Cardiovascular: Negative.    Maternal Medical History:  Fetal activity: Perceived fetal activity is normal.    Prenatal complications: PIH.   Prenatal Complications - Diabetes: none.   Intracervical foley placed Dilation: Fingertip Effacement (%): Thick Station: -3 Exam by::  Gearldine Bienenstock, RN Blood pressure (!) 151/104, pulse 81, temperature 98.2 F (36.8 C), temperature source Oral, height 4\' 11"  (1.499 m), weight 81.6 kg, last menstrual period 11/04/2018. Maternal Exam:  Uterine Assessment: Contraction strength is mild.  Contraction frequency is irregular.   Abdomen: Patient reports no abdominal tenderness. Estimated fetal weight is 6.5 lbs.   Fetal presentation: vertex  Introitus: Normal vulva. Normal vagina.  Amniotic fluid character: not assessed.  Pelvis: adequate for delivery.      Fetal Exam Fetal Monitor Review: Mode: ultrasound.   Baseline rate: 120-130.  Variability: moderate (6-25 bpm).   Pattern: accelerations present and no decelerations.    Fetal State Assessment: Category I - tracings are normal.     Physical Exam  Vitals reviewed. Constitutional: She appears well-developed and well-nourished.  Cardiovascular: Normal rate and regular rhythm.  Respiratory: Effort normal. No respiratory distress.  GI: Soft.  Genitourinary:    Vulva normal.     Prenatal labs: ABO, Rh: --/--/O POS (06/07 0114) Antibody: NEG (06/07 0114) Rubella: Immune (12/31 0000) RPR: NON REACTIVE (05/07 1243)  HBsAg: Negative (12/31 0000)  HIV: Non-reactive (12/31 0000)  GBS:   neg  Assessment/Plan: IUP at 37+ weeks, CHTN with possible superimposed preeclampsia, unfavorable cervix.  She was admitted last pm,  has received 2 doses of cytotec, cervix still not significantly dilated so intracervical foley placed.  Will continue cytotec for now, monitor BP and treat prn, monitor progress.   Blane Ohara Breionna Punt 07/23/2019, 7:50 AM

## 2019-07-23 NOTE — Progress Notes (Signed)
Feeling some ctx.  BP has been elevated, received her PO Procardia, IV Labetalol and hydralazine Afeb, VSS, BP 127/75, has been as high as 179/115 FHT- 130s, mod variability, + accels, occ variable or early decel, Cat II, irreg ctx VE-deferred, intracervical foley still in place Will continue cytotec q 4 hrs until foley comes out, monitor and treat BP prn

## 2019-07-23 NOTE — Progress Notes (Signed)
Feeling some ctx Afeb, VSS, BP stable FHT-130s, mod variability, + accels, occasional variable decel so Cat II, irreg ctx VE-2 cm per RN with last cytotec at 1355, foley bulb was starting to bulge  Starting to make some progress with cytotec and foley, BP stable for now.  Will continue cytotec until foley comes out, treat BP prn

## 2019-07-24 ENCOUNTER — Inpatient Hospital Stay (HOSPITAL_COMMUNITY): Payer: BC Managed Care – PPO | Admitting: Anesthesiology

## 2019-07-24 ENCOUNTER — Encounter (HOSPITAL_COMMUNITY): Payer: Self-pay | Admitting: Obstetrics and Gynecology

## 2019-07-24 ENCOUNTER — Encounter (HOSPITAL_COMMUNITY)
Admission: AD | Disposition: A | Discharge status: Home / Self Care | Payer: Self-pay | Source: Home / Self Care | Attending: Obstetrics and Gynecology

## 2019-07-24 LAB — CBC
HCT: 40.7 % (ref 36.0–46.0)
Hemoglobin: 13.2 g/dL (ref 12.0–15.0)
MCH: 28.6 pg (ref 26.0–34.0)
MCHC: 32.4 g/dL (ref 30.0–36.0)
MCV: 88.1 fL (ref 80.0–100.0)
Platelets: 233 10*3/uL (ref 150–400)
RBC: 4.62 MIL/uL (ref 3.87–5.11)
RDW: 19.9 % — ABNORMAL HIGH (ref 11.5–15.5)
WBC: 23.2 10*3/uL — ABNORMAL HIGH (ref 4.0–10.5)
nRBC: 0.1 % (ref 0.0–0.2)

## 2019-07-24 SURGERY — Surgical Case
Anesthesia: Epidural | Wound class: Clean Contaminated

## 2019-07-24 MED ORDER — SODIUM CHLORIDE 0.9 % IV SOLN
2.0000 g | Freq: Four times a day (QID) | INTRAVENOUS | Status: DC
  Administered 2019-07-24 – 2019-07-25 (×4): 2 g via INTRAVENOUS
  Filled 2019-07-24 (×3): qty 2
  Filled 2019-07-24 (×3): qty 2000

## 2019-07-24 MED ORDER — SIMETHICONE 80 MG PO CHEW: 80.0000 mg | CHEWABLE_TABLET | ORAL | Status: DC | PRN

## 2019-07-24 MED ORDER — FENTANYL-BUPIVACAINE-NACL 0.5-0.125-0.9 MG/250ML-% EP SOLN
12.0000 mL/h | EPIDURAL | Status: DC | PRN
  Filled 2019-07-24: qty 250

## 2019-07-24 MED ORDER — ONDANSETRON HCL 4 MG/2ML IJ SOLN: 4.0000 mg | Freq: Three times a day (TID) | INTRAMUSCULAR | Status: DC | PRN

## 2019-07-24 MED ORDER — FENTANYL CITRATE (PF) 100 MCG/2ML IJ SOLN
INTRAMUSCULAR | Status: DC | PRN
  Administered 2019-07-24: 100 ug via EPIDURAL

## 2019-07-24 MED ORDER — SODIUM CHLORIDE 0.9 % IR SOLN
Status: DC | PRN
  Administered 2019-07-24: 1000 mL

## 2019-07-24 MED ORDER — OXYTOCIN-SODIUM CHLORIDE 30-0.9 UT/500ML-% IV SOLN
INTRAVENOUS | Status: DC | PRN
  Administered 2019-07-24: 30 [IU] via INTRAVENOUS

## 2019-07-24 MED ORDER — LABETALOL HCL 100 MG PO TABS
100.0000 mg | ORAL_TABLET | Freq: Two times a day (BID) | ORAL | Status: DC
  Administered 2019-07-24 – 2019-07-27 (×6): 100 mg via ORAL
  Filled 2019-07-24 (×6): qty 1

## 2019-07-24 MED ORDER — OXYTOCIN-SODIUM CHLORIDE 30-0.9 UT/500ML-% IV SOLN: 2.5000 [IU]/h | INTRAVENOUS | Status: AC

## 2019-07-24 MED ORDER — LIDOCAINE-EPINEPHRINE (PF) 2 %-1:200000 IJ SOLN
INTRAMUSCULAR | Status: DC | PRN
  Administered 2019-07-24: 2 mL via EPIDURAL
  Administered 2019-07-24: 5 mL via EPIDURAL
  Administered 2019-07-24: 3 mL via EPIDURAL

## 2019-07-24 MED ORDER — DIPHENHYDRAMINE HCL 25 MG PO CAPS: 25.0000 mg | ORAL_CAPSULE | ORAL | Status: DC | PRN

## 2019-07-24 MED ORDER — DIBUCAINE (PERIANAL) 1 % EX OINT: 1.0000 "application " | TOPICAL_OINTMENT | CUTANEOUS | Status: DC | PRN

## 2019-07-24 MED ORDER — MORPHINE SULFATE (PF) 0.5 MG/ML IJ SOLN
INTRAMUSCULAR | Status: DC | PRN
  Administered 2019-07-24: 3 mg via EPIDURAL

## 2019-07-24 MED ORDER — NALBUPHINE HCL 10 MG/ML IJ SOLN: 5.0000 mg | INTRAMUSCULAR | Status: DC | PRN

## 2019-07-24 MED ORDER — LIDOCAINE-EPINEPHRINE (PF) 2 %-1:200000 IJ SOLN
INTRAMUSCULAR | Status: AC
  Filled 2019-07-24: qty 10

## 2019-07-24 MED ORDER — EPHEDRINE 5 MG/ML INJ: 10.0000 mg | INTRAVENOUS | Status: DC | PRN

## 2019-07-24 MED ORDER — ZOLPIDEM TARTRATE 5 MG PO TABS: 5.0000 mg | ORAL_TABLET | Freq: Every evening | ORAL | Status: DC | PRN

## 2019-07-24 MED ORDER — NALOXONE HCL 0.4 MG/ML IJ SOLN: 0.4000 mg | INTRAMUSCULAR | Status: DC | PRN

## 2019-07-24 MED ORDER — SODIUM CHLORIDE (PF) 0.9 % IJ SOLN
INTRAMUSCULAR | Status: DC | PRN
  Administered 2019-07-24: 12 mL/h via EPIDURAL

## 2019-07-24 MED ORDER — SOD CITRATE-CITRIC ACID 500-334 MG/5ML PO SOLN
30.0000 mL | ORAL | Status: AC
  Administered 2019-07-24: 30 mL via ORAL

## 2019-07-24 MED ORDER — NALBUPHINE HCL 10 MG/ML IJ SOLN: 5.0000 mg | Freq: Once | INTRAMUSCULAR | Status: DC | PRN

## 2019-07-24 MED ORDER — ONDANSETRON HCL 4 MG/2ML IJ SOLN
INTRAMUSCULAR | Status: DC | PRN
  Administered 2019-07-24: 4 mg via INTRAVENOUS

## 2019-07-24 MED ORDER — IBUPROFEN 800 MG PO TABS
800.0000 mg | ORAL_TABLET | Freq: Four times a day (QID) | ORAL | Status: DC
  Administered 2019-07-25 – 2019-07-27 (×8): 800 mg via ORAL
  Filled 2019-07-24 (×9): qty 1

## 2019-07-24 MED ORDER — SODIUM CHLORIDE 0.9% FLUSH: 3.0000 mL | INTRAVENOUS | Status: DC | PRN

## 2019-07-24 MED ORDER — HYDROMORPHONE HCL 1 MG/ML IJ SOLN: 0.2500 mg | INTRAMUSCULAR | Status: DC | PRN

## 2019-07-24 MED ORDER — STERILE WATER FOR IRRIGATION IR SOLN
Status: DC | PRN
  Administered 2019-07-24: 1000 mL

## 2019-07-24 MED ORDER — CLINDAMYCIN PHOSPHATE 900 MG/50ML IV SOLN
900.0000 mg | Freq: Once | INTRAVENOUS | Status: AC
  Administered 2019-07-24: 900 mg via INTRAVENOUS
  Filled 2019-07-24 (×2): qty 50

## 2019-07-24 MED ORDER — KETOROLAC TROMETHAMINE 30 MG/ML IJ SOLN
30.0000 mg | Freq: Four times a day (QID) | INTRAMUSCULAR | Status: AC
  Administered 2019-07-24 – 2019-07-25 (×3): 30 mg via INTRAVENOUS
  Filled 2019-07-24 (×3): qty 1

## 2019-07-24 MED ORDER — ONDANSETRON HCL 4 MG/2ML IJ SOLN
INTRAMUSCULAR | Status: AC
  Filled 2019-07-24: qty 2

## 2019-07-24 MED ORDER — LIDOCAINE HCL (PF) 1 % IJ SOLN
INTRAMUSCULAR | Status: DC | PRN
  Administered 2019-07-24: 10 mL via EPIDURAL
  Administered 2019-07-24: 1 mL via EPIDURAL

## 2019-07-24 MED ORDER — DEXAMETHASONE SODIUM PHOSPHATE 4 MG/ML IJ SOLN
INTRAMUSCULAR | Status: AC
  Filled 2019-07-24: qty 1

## 2019-07-24 MED ORDER — SIMETHICONE 80 MG PO CHEW
80.0000 mg | CHEWABLE_TABLET | Freq: Three times a day (TID) | ORAL | Status: DC
  Administered 2019-07-24 – 2019-07-27 (×8): 80 mg via ORAL
  Filled 2019-07-24 (×8): qty 1

## 2019-07-24 MED ORDER — PHENYLEPHRINE 40 MCG/ML (10ML) SYRINGE FOR IV PUSH (FOR BLOOD PRESSURE SUPPORT): 80.0000 ug | PREFILLED_SYRINGE | INTRAVENOUS | Status: DC | PRN

## 2019-07-24 MED ORDER — LACTATED RINGERS IV SOLN
500.0000 mL | Freq: Once | INTRAVENOUS | Status: AC
  Administered 2019-07-24: 500 mL via INTRAVENOUS

## 2019-07-24 MED ORDER — OXYCODONE HCL 5 MG/5ML PO SOLN: 5.0000 mg | Freq: Once | ORAL | Status: DC | PRN

## 2019-07-24 MED ORDER — SCOPOLAMINE 1 MG/3DAYS TD PT72: 1.0000 | MEDICATED_PATCH | Freq: Once | TRANSDERMAL | Status: DC

## 2019-07-24 MED ORDER — TETANUS-DIPHTH-ACELL PERTUSSIS 5-2.5-18.5 LF-MCG/0.5 IM SUSP: 0.5000 mL | Freq: Once | INTRAMUSCULAR | Status: DC

## 2019-07-24 MED ORDER — DIPHENHYDRAMINE HCL 50 MG/ML IJ SOLN: 12.5000 mg | INTRAMUSCULAR | Status: DC | PRN

## 2019-07-24 MED ORDER — FENTANYL CITRATE (PF) 100 MCG/2ML IJ SOLN
INTRAMUSCULAR | Status: AC
  Filled 2019-07-24: qty 2

## 2019-07-24 MED ORDER — KETOROLAC TROMETHAMINE 30 MG/ML IJ SOLN: 30.0000 mg | Freq: Once | INTRAMUSCULAR | Status: DC | PRN

## 2019-07-24 MED ORDER — SIMETHICONE 80 MG PO CHEW
80.0000 mg | CHEWABLE_TABLET | ORAL | Status: DC
  Administered 2019-07-24 – 2019-07-27 (×3): 80 mg via ORAL
  Filled 2019-07-24 (×3): qty 1

## 2019-07-24 MED ORDER — WITCH HAZEL-GLYCERIN EX PADS: 1.0000 "application " | MEDICATED_PAD | CUTANEOUS | Status: DC | PRN

## 2019-07-24 MED ORDER — OXYCODONE HCL 5 MG PO TABS: 5.0000 mg | ORAL_TABLET | ORAL | Status: DC | PRN

## 2019-07-24 MED ORDER — LACTATED RINGERS IV SOLN: INTRAVENOUS | Status: DC

## 2019-07-24 MED ORDER — CARBOPROST TROMETHAMINE 250 MCG/ML IM SOLN
INTRAMUSCULAR | Status: AC
  Filled 2019-07-24: qty 1

## 2019-07-24 MED ORDER — SODIUM CHLORIDE 0.9 % IV SOLN: INTRAVENOUS | Status: DC | PRN

## 2019-07-24 MED ORDER — PRENATAL MULTIVITAMIN CH
1.0000 | ORAL_TABLET | Freq: Every day | ORAL | Status: DC
  Administered 2019-07-25 – 2019-07-27 (×3): 1 via ORAL
  Filled 2019-07-24 (×3): qty 1

## 2019-07-24 MED ORDER — MENTHOL 3 MG MT LOZG: 1.0000 | LOZENGE | OROMUCOSAL | Status: DC | PRN

## 2019-07-24 MED ORDER — OXYCODONE HCL 5 MG PO TABS: 5.0000 mg | ORAL_TABLET | Freq: Once | ORAL | Status: DC | PRN

## 2019-07-24 MED ORDER — NALOXONE HCL 4 MG/10ML IJ SOLN
1.0000 ug/kg/h | INTRAVENOUS | Status: DC | PRN
  Filled 2019-07-24: qty 5

## 2019-07-24 MED ORDER — SENNOSIDES-DOCUSATE SODIUM 8.6-50 MG PO TABS
2.0000 | ORAL_TABLET | ORAL | Status: DC
  Administered 2019-07-24 – 2019-07-25 (×2): 2 via ORAL
  Filled 2019-07-24 (×3): qty 2

## 2019-07-24 MED ORDER — GENTAMICIN SULFATE 40 MG/ML IJ SOLN
5.0000 mg/kg | INTRAVENOUS | Status: DC
  Administered 2019-07-24: 410 mg via INTRAVENOUS
  Filled 2019-07-24 (×2): qty 10.25

## 2019-07-24 MED ORDER — DIPHENHYDRAMINE HCL 25 MG PO CAPS: 25.0000 mg | ORAL_CAPSULE | Freq: Four times a day (QID) | ORAL | Status: DC | PRN

## 2019-07-24 MED ORDER — CARBOPROST TROMETHAMINE 250 MCG/ML IM SOLN
INTRAMUSCULAR | Status: DC | PRN
  Administered 2019-07-24: 250 ug via INTRAMUSCULAR

## 2019-07-24 MED ORDER — PHENYLEPHRINE 40 MCG/ML (10ML) SYRINGE FOR IV PUSH (FOR BLOOD PRESSURE SUPPORT)
80.0000 ug | PREFILLED_SYRINGE | INTRAVENOUS | Status: DC | PRN
  Filled 2019-07-24: qty 10

## 2019-07-24 MED ORDER — COCONUT OIL OIL
1.0000 "application " | TOPICAL_OIL | Status: DC | PRN
  Administered 2019-07-26: 1 via TOPICAL

## 2019-07-24 MED ORDER — TRANEXAMIC ACID-NACL 1000-0.7 MG/100ML-% IV SOLN
INTRAVENOUS | Status: DC | PRN
Start: 2019-07-24 — End: 2019-07-24
  Administered 2019-07-24: 1000 mg via INTRAVENOUS

## 2019-07-24 MED ORDER — ONDANSETRON HCL 4 MG/2ML IJ SOLN: 4.0000 mg | Freq: Once | INTRAMUSCULAR | Status: DC | PRN

## 2019-07-24 MED ORDER — MORPHINE SULFATE (PF) 0.5 MG/ML IJ SOLN
INTRAMUSCULAR | Status: AC
  Filled 2019-07-24: qty 10

## 2019-07-24 MED ORDER — TRANEXAMIC ACID-NACL 1000-0.7 MG/100ML-% IV SOLN
INTRAVENOUS | Status: AC
  Filled 2019-07-24: qty 100

## 2019-07-24 SURGICAL SUPPLY — 39 items
CHLORAPREP W/TINT 26ML (MISCELLANEOUS) ×3 IMPLANT
CLAMP CORD UMBIL (MISCELLANEOUS) IMPLANT
CLOTH BEACON ORANGE TIMEOUT ST (SAFETY) ×3 IMPLANT
DERMABOND ADVANCED (GAUZE/BANDAGES/DRESSINGS) ×2
DERMABOND ADVANCED .7 DNX12 (GAUZE/BANDAGES/DRESSINGS) ×1 IMPLANT
DRSG OPSITE POSTOP 4X10 (GAUZE/BANDAGES/DRESSINGS) ×3 IMPLANT
ELECT REM PT RETURN 9FT ADLT (ELECTROSURGICAL) ×3
ELECTRODE REM PT RTRN 9FT ADLT (ELECTROSURGICAL) ×1 IMPLANT
EXTRACTOR VACUUM BELL STYLE (SUCTIONS) IMPLANT
GAUZE SPONGE 4X4 12PLY STRL LF (GAUZE/BANDAGES/DRESSINGS) IMPLANT
GLOVE BIO SURGEON STRL SZ 6 (GLOVE) ×3 IMPLANT
GLOVE BIOGEL PI IND STRL 6.5 (GLOVE) ×1 IMPLANT
GLOVE BIOGEL PI IND STRL 7.0 (GLOVE) ×2 IMPLANT
GLOVE BIOGEL PI INDICATOR 6.5 (GLOVE) ×2
GLOVE BIOGEL PI INDICATOR 7.0 (GLOVE) ×4
GOWN STRL REUS W/TWL LRG LVL3 (GOWN DISPOSABLE) ×6 IMPLANT
HEMOSTAT ARISTA ABSORB 3G PWDR (HEMOSTASIS) ×3 IMPLANT
KIT ABG SYR 3ML LUER SLIP (SYRINGE) IMPLANT
NEEDLE HYPO 25X5/8 SAFETYGLIDE (NEEDLE) ×3 IMPLANT
NS IRRIG 1000ML POUR BTL (IV SOLUTION) ×3 IMPLANT
PACK C SECTION WH (CUSTOM PROCEDURE TRAY) ×3 IMPLANT
PAD ABD 8X10 STRL (GAUZE/BANDAGES/DRESSINGS) IMPLANT
PAD OB MATERNITY 4.3X12.25 (PERSONAL CARE ITEMS) ×3 IMPLANT
PENCIL SMOKE EVAC W/HOLSTER (ELECTROSURGICAL) ×3 IMPLANT
RETAINER VISCERAL (MISCELLANEOUS) ×3 IMPLANT
RTRCTR C-SECT PINK 25CM LRG (MISCELLANEOUS) IMPLANT
SUT PDS AB 0 CTX 36 PDP370T (SUTURE) IMPLANT
SUT PLAIN 2 0 (SUTURE) ×2
SUT PLAIN 2 0 XLH (SUTURE) ×3 IMPLANT
SUT PLAIN ABS 2-0 CT1 27XMFL (SUTURE) ×1 IMPLANT
SUT VIC AB 0 CT1 36 (SUTURE) ×9 IMPLANT
SUT VIC AB 2-0 CT1 27 (SUTURE) ×2
SUT VIC AB 2-0 CT1 TAPERPNT 27 (SUTURE) ×1 IMPLANT
SUT VIC AB 3-0 SH 27 (SUTURE)
SUT VIC AB 3-0 SH 27X BRD (SUTURE) IMPLANT
SUT VIC AB 4-0 KS 27 (SUTURE) ×3 IMPLANT
TOWEL OR 17X24 6PK STRL BLUE (TOWEL DISPOSABLE) ×3 IMPLANT
TRAY FOLEY W/BAG SLVR 14FR LF (SET/KITS/TRAYS/PACK) ×3 IMPLANT
WATER STERILE IRR 1000ML POUR (IV SOLUTION) ×3 IMPLANT

## 2019-07-24 NOTE — Anesthesia Preprocedure Evaluation (Signed)
Anesthesia Evaluation  Patient identified by MRN, date of birth, ID band Patient awake    Reviewed: Allergy & Precautions, Patient's Chart, lab work & pertinent test results  Airway Mallampati: II  TM Distance: >3 FB Neck ROM: Full    Dental no notable dental hx.    Pulmonary neg pulmonary ROS,    Pulmonary exam normal breath sounds clear to auscultation       Cardiovascular hypertension, Pt. on home beta blockers Normal cardiovascular exam Rhythm:Regular Rate:Normal  PIH   Neuro/Psych negative neurological ROS  negative psych ROS   GI/Hepatic negative GI ROS, Neg liver ROS,   Endo/Other  Obesity BMI 36  Renal/GU negative Renal ROS  negative genitourinary   Musculoskeletal negative musculoskeletal ROS (+)   Abdominal   Peds negative pediatric ROS (+)  Hematology negative hematology ROS (+) hct 38.1, plt 262   Anesthesia Other Findings   Reproductive/Obstetrics (+) Pregnancy                             Anesthesia Physical Anesthesia Plan  ASA: III and emergent  Anesthesia Plan: Epidural   Post-op Pain Management:    Induction:   PONV Risk Score and Plan: 2  Airway Management Planned: Natural Airway  Additional Equipment: None  Intra-op Plan:   Post-operative Plan:   Informed Consent: I have reviewed the patients History and Physical, chart, labs and discussed the procedure including the risks, benefits and alternatives for the proposed anesthesia with the patient or authorized representative who has indicated his/her understanding and acceptance.       Plan Discussed with:   Anesthesia Plan Comments:         Anesthesia Quick Evaluation

## 2019-07-24 NOTE — Anesthesia Postprocedure Evaluation (Signed)
Anesthesia Post Note  Patient: Connie Mccarty  Procedure(s) Performed: CESAREAN SECTION (N/A )     Patient location during evaluation: Mother Baby Anesthesia Type: Epidural Level of consciousness: oriented and awake and alert Pain management: pain level controlled Vital Signs Assessment: post-procedure vital signs reviewed and stable Respiratory status: spontaneous breathing and respiratory function stable Cardiovascular status: blood pressure returned to baseline and stable Postop Assessment: no headache, no backache, no apparent nausea or vomiting and able to ambulate Anesthetic complications: no    Last Vitals:  Vitals:   07/24/19 1530 07/24/19 1538  BP: (!) 118/93 115/82  Pulse: (!) 109 (!) 104  Resp: (!) 22 (!) 22  Temp:    SpO2:  100%    Last Pain:  Vitals:   07/24/19 1530  TempSrc:   PainSc: 4    Pain Goal:                Epidural/Spinal Function Cutaneous sensation: Tingles (07/24/19 1530), Patient able to flex knees: Yes (07/24/19 1530), Patient able to lift hips off bed: No (07/24/19 1530), Back pain beyond tenderness at insertion site: No (07/24/19 1530), Progressively worsening motor and/or sensory loss: No (07/24/19 1530), Bowel and/or bladder incontinence post epidural: No (07/24/19 1530)  Trevor Iha

## 2019-07-24 NOTE — Transfer of Care (Signed)
Immediate Anesthesia Transfer of Care Note  Patient: Connie Mccarty  Procedure(s) Performed: CESAREAN SECTION (N/A )  Patient Location: PACU  Anesthesia Type:Spinal  Level of Consciousness: awake, alert  and oriented  Airway & Oxygen Therapy: Patient Spontanous Breathing  Post-op Assessment: Report given to RN and Post -op Vital signs reviewed and stable  Post vital signs: Reviewed and stable  Last Vitals:  Vitals Value Taken Time  BP 133/77 07/24/19 1431  Temp    Pulse 107 07/24/19 1433  Resp 18 07/24/19 1433  SpO2 96 % 07/24/19 1433  Vitals shown include unvalidated device data.  Last Pain:  Vitals:   07/24/19 1101  TempSrc: Oral  PainSc:          Complications: No apparent anesthesia complications

## 2019-07-24 NOTE — Lactation Note (Signed)
This note was copied from a baby's chart. Lactation Consultation Note  Patient Name: Connie Mccarty [name] Date: 07/24/2019 Reason for consult: Initial assessment;Early term 37-38.6wks;Difficult latch;Primapara;1st time breastfeeding  Baby is 8 hours old of a P1 mother. Baby is sleeping in basinet upon arrival and mother states baby is sleeping and has not feed since delivery. Mother states she wants to breastfeed exclusively and eventually when back to work may use formula.   Baby started showing hunger cues. Offered assistance to latch baby and mother agreed. Unswaddled baby and tried latching her to right breast on cradle hold but baby fell asleep again. Hand-expressed about 93mL of colostrum and offered it to baby with a spoon.  Reviewed with mother average size of a NB stomach. Encourage to follow babies' hunger and fullness cues. Reviewed importance to offer the breast 8 to 12 times in a 24-hour period for proper stimulation and to establish good milk supply.    Reviewed colostrum benefits for baby.   Encouraged mother to have baby skin to skin and/or unwrap baby and change diaper to wake her up. Reviewed breastfeeding basics. Discussed milk coming to volume. Reviewed ETI behavior and expectations with mother and encouraged to contact LC for support when ready to breastfeed baby and recommended to request help for questions or concerns.    All questions answered at this time.   Maternal Data Formula Feeding for Exclusion: No Has patient been taught Hand Expression?: Yes Does the patient have breastfeeding experience prior to this delivery?: No  Feeding Feeding Type: Breast Milk  LATCH Score Latch: Too sleepy or reluctant, no latch achieved, no sucking elicited.  Audible Swallowing: None  Type of Nipple: Everted at rest and after stimulation  Comfort (Breast/Nipple): Soft / non-tender  Hold (Positioning): Assistance needed to correctly position infant at breast and  maintain latch.  LATCH Score: 5  Interventions Interventions: Breast feeding basics reviewed;Assisted with latch;Skin to skin;Breast massage;Hand express;Adjust position;Support pillows;Expressed milk  Lactation Tools Discussed/Used WIC Program: No   Consult Status Consult Status: Follow-up Date: 07/25/19 Follow-up type: In-patient    Connie Mccarty [name] 07/24/2019, 9:46 PM

## 2019-07-24 NOTE — Progress Notes (Signed)
Ce unchanged. Maternal tachycardia, fetal tachycardia with mini var (+fetal scalp stim). Section called at this time for arrest of dilation. Plan for PLTCS at this time

## 2019-07-24 NOTE — Anesthesia Procedure Notes (Signed)
Epidural Patient location during procedure: OB Start time: 07/24/2019 2:51 AM End time: 07/24/2019 2:59 AM  Staffing Anesthesiologist: Lannie Fields, DO Performed: anesthesiologist   Preanesthetic Checklist Completed: patient identified, IV checked, risks and benefits discussed, monitors and equipment checked, pre-op evaluation and timeout performed  Epidural Patient position: sitting Prep: DuraPrep and site prepped and draped Patient monitoring: continuous pulse ox, blood pressure, heart rate and cardiac monitor Approach: midline Location: L3-L4 Injection technique: LOR air  Needle:  Needle type: Tuohy  Needle gauge: 17 G Needle length: 9 cm Needle insertion depth: 5 cm Catheter type: closed end flexible Catheter size: 19 Gauge Catheter at skin depth: 10 cm Test dose: negative  Assessment Sensory level: T8 Events: blood not aspirated, injection not painful, no injection resistance, no paresthesia and negative IV test  Additional Notes Patient identified. Risks/Benefits/Options discussed with patient including but not limited to bleeding, infection, nerve damage, paralysis, failed block, incomplete pain control, headache, blood pressure changes, nausea, vomiting, reactions to medication both or allergic, itching and postpartum back pain. Confirmed with bedside nurse the patient's most recent platelet count. Confirmed with patient that they are not currently taking any anticoagulation, have any bleeding history or any family history of bleeding disorders. Patient expressed understanding and wished to proceed. All questions were answered. Sterile technique was used throughout the entire procedure. Please see nursing notes for vital signs. Test dose was given through epidural catheter and negative prior to continuing to dose epidural or start infusion. Warning signs of high block given to the patient including shortness of breath, tingling/numbness in hands, complete motor block,  or any concerning symptoms with instructions to call for help. Patient was given instructions on fall risk and not to get out of bed. All questions and concerns addressed with instructions to call with any issues or inadequate analgesia.  Reason for block:procedure for pain

## 2019-07-24 NOTE — Progress Notes (Signed)
Comfortable with epidural Afeb, VSS, BP stable FHT-120-130, mod variability, some variable and early decels, Cat II, ctx q 2-3 min VE-7/90/-1 per RN  Has most likely been in latent labor, now getting into active labor and vertex is more applied to cervix.  BP stable.  Will continue pitocin, monitor progress and BP

## 2019-07-24 NOTE — Progress Notes (Signed)
Pitocin turned down to 68mU/min while I was in another delivery 2/2 late decelerations which have now resolved. IUPC in place, adequate contractions even with reduced dose. However, CE at this time still 7/90/-1. Patient now endorses fevers, chills, warm on internal exam. Concern for developing triple I, will initiate Amp/Gent at this time (ROM >12hrs). Will recheck CE at noon, if no change, will call PLTCS of arrest of dilation  R/B/A of cesarean section discussed with patient. Alternative would be vaginal delivery which would mean shorter postpartum stay and decreased risk of bleeding. Risks of section include infection of the uterus, pelvic organs, or skin, inadvertent injury to internal organs, such as bowel or bladder. If there is major injury, extensive surgery may be required. If injury is minor, it may be treated with relative ease. Discussed possibility of excessive blood loss and transfusion. If bleeding cannot be controlled using medical or minor surgical methods, a cesarean hysterectomy may be performed which would mean no future fertility. Patient accepts the possibility of blood transfusion, if necessary. Patient understands and agrees to move forward with section.

## 2019-07-24 NOTE — Progress Notes (Signed)
ANTIBIOTIC CONSULT NOTE - INITIAL  Pharmacy Consult for Gentamicin Indication: Chorioamnionitis   No Known Allergies  Patient Measurements: Height: 4\' 11"  (149.9 cm) Weight: 81.6 kg (179 lb 12.8 oz) IBW/kg (Calculated) : 43.2   Vital Signs: Temp: 99.3 F (37.4 C) (06/08 1101) Temp Source: Oral (06/08 1101) BP: 136/81 (06/08 1101) Pulse Rate: 107 (06/08 1101)  Labs: Recent Labs    07/23/19 0114 07/24/19 0221  WBC 10.4 23.2*  HGB 12.4 13.2  PLT 262 233  CREATININE 0.58  --    No results for input(s): GENTTROUGH, GENTPEAK, GENTRANDOM in the last 72 hours.   Microbiology: Recent Results (from the past 720 hour(s))  SARS CORONAVIRUS 2 (TAT 6-24 HRS) Nasopharyngeal Nasopharyngeal Swab     Status: None   Collection Time: 07/21/19  1:16 PM   Specimen: Nasopharyngeal Swab  Result Value Ref Range Status   SARS Coronavirus 2 NEGATIVE NEGATIVE Final    Comment: (NOTE) SARS-CoV-2 target nucleic acids are NOT DETECTED. The SARS-CoV-2 RNA is generally detectable in upper and lower respiratory specimens during the acute phase of infection. Negative results do not preclude SARS-CoV-2 infection, do not rule out co-infections with other pathogens, and should not be used as the sole basis for treatment or other patient management decisions. Negative results must be combined with clinical observations, patient history, and epidemiological information. The expected result is Negative. Fact Sheet for Patients: 09/20/19 Fact Sheet for Healthcare Providers: HairSlick.no This test is not yet approved or cleared by the [employer]ier[employer]irigir.[employer] FDA and  has been authorized for detection and/or diagnosis of SARS-CoV-2 by FDA under an Emergency Use Authorization (EUA). This EUA will remain  in effect (meaning this test can be used) for the duration of the COVID-19 declaration under Section 56 4(b)(1) of the Act, 21 U.S.C. section  360bbb-3(b)(1), unless the authorization is terminated or revoked sooner. Performed at [facility], [address] [place], [place]     Medications:  Ampicillin  Assessment: 29 y.o. female G1P0 at [redacted]w[redacted]d with fever during labor  Plan:  Gentamicin 410 mg (5 mg/kt) IV every 24 hrs  Will check gentamicin levels if continued > 72hr or clinically indicated.  [redacted]w[redacted]d 07/24/2019,11:15 AM

## 2019-07-24 NOTE — Op Note (Signed)
C-Section Operative Note  Date: 07/24/19  Surgeon: Eula Flax, MD Assist: Janace Aris, CST Preoperative Diagnosis: IUP @ 37 3/7, arrest of dilation, triple I Postoperative Diagnosis: Same as above Procedure: Primary low transverse cesarean section Findings: Viable female infant with APGARS of 7 and 8 at 1 and 5 minutes, respectively. Normal appearing uterus, bilateral fallopian tubes and ovaries. Specimens: Placenta to pathology EBL 685 IVF 1200 UOP 200  Patient Course: Patient admitted for induction of labor for [facility] with worsening Bps. No PreE symptoms however increasing proteinuria but otherwise stable labs. Ripened with foley bulb and cytotec, s/p AROM and pitocin per protocol. IUPC placed on HD#2, no change despite adequate MVUs from 7cm. Patient met criteria for triple I, Amp/Gent started. Given arrest of dilation, PLTCs called.   Consent:  R/B/A of cesarean section discussed with patient. Alternative would be vaginal delivery which would mean shorter postpartum stay and decreased risk of bleeding. Risks of section include infection of the uterus, pelvic organs, or skin, inadvertent injury to internal organs, such as bowel or bladder. If there is major injury, extensive surgery may be required. If injury is minor, it may be treated with relative ease. Discussed possibility of excessive blood loss and transfusion. If bleeding cannot be controlled using medical or minor surgical methods, a cesarean hysterectomy may be performed which would mean no future fertility. Patient accepts the possibility of blood transfusion, if necessary. Patient understands and agrees to move forward with section.   Operative Procedure: Patient was taken to the operating room where epidural anesthesia was found to be adequate by Allis clamp test. She was prepped and draped in the normal sterile fashion in the dorsal supine position with a leftward tilt. An appropriate time out was performed. A Pfannenstiel  skin incision was then made with the scalpel and carried through to the underlying layer of fascia by sharp dissection and Bovie cautery. The fascia was nicked in the midline and the incision was extended laterally with Mayo scissors. The superior aspect of the incision was grasped Coker clamps and dissected off the underlying rectus muscles. In a similar fashion the inferior aspect was dissected off the rectus muscles. Perforating vessel noted as pumped, figure of eight x2 used for hemostasis. Rectus muscles were separated in the midline and the peritoneal cavity entered bluntly. The peritoneal incision was then extended both superiorly and inferiorly with careful attention to avoid both bowel and bladder. The Alexis self-retaining wound retractor was then placed within the incision and the lower uterine segment exposed. Moist lap x2 used to pack away bowel. The bladder flap was developed with Metzenbaum scissors and pushed away from the lower uterine segment. The lower uterine segment was then incised in a low fashion and the cavity itself entered bluntly. The incision was extended bluntly. Amniotic sac was ruptured and fluid was noted to be thick-meconium stained in color with foul odor consistent with triple I. The infant's head was then lifted and delivered from the incision without difficulty using the standard movements. The remainder of the infant delivered and the nose and mouth bulb suctioned with the cord clamped and cut as well. The infant was handed off to the NICU. The placenta was then spontaneously expressed from the uterus and the uterus cleared of all clots and debris with moist lap sponge. The uterine incision was then repaired in 2 layers the first layer was a running locked layer of 0-vicryl and the second an imbricating layer of the same suture. The tubes and ovaries  were inspected and the gutters cleared of all clots and debris. The uterine incision was inspected and found to be hemostatic.  All instruments and sponges as well as the Alexis retractor were then removed from the abdomen.  The rectus muscles were then reapproximated with figure of eight with 2-0 plain. The fascia was then closed with 0 Vicryl in a running fashion. Subcutaneous tissue was reapproximated with 3-0 plain in a running fashion. The skin was closed with a subcuticular stitch of 4-0 Vicryl on a Keith needle and then reinforced with Dermabond. At the conclusion of the procedure all instruments and sponge counts were correct. Patient was taken to the recovery room in good condition with her baby accompanying her skin to skin.   IM methergine was given in RLE for mild uterine atony which was expected given long induction process.

## 2019-07-24 NOTE — Progress Notes (Signed)
BP 120/73   Pulse 89   Temp 98.9 F (37.2 C) (Oral)   Resp 18   Ht 4\' 11"  (1.499 m)   Wt 81.6 kg   LMP 11/04/2018   SpO2 99%   BMI 36.32 kg/m   CE unchanged from earlier, slight edema circumferentially. IUPC placed at 0720. Pt comfortable s/p epidural, denies PreE symptoms. Cat 1 tracing. Continue to titrate pitocin at this time.

## 2019-07-25 ENCOUNTER — Encounter (HOSPITAL_COMMUNITY): Payer: Self-pay | Admitting: Anesthesiology

## 2019-07-25 LAB — CBC
HCT: 27.8 % — ABNORMAL LOW (ref 36.0–46.0)
Hemoglobin: 9 g/dL — ABNORMAL LOW (ref 12.0–15.0)
MCH: 28.8 pg (ref 26.0–34.0)
MCHC: 32.4 g/dL (ref 30.0–36.0)
MCV: 89.1 fL (ref 80.0–100.0)
Platelets: 197 10*3/uL (ref 150–400)
RBC: 3.12 MIL/uL — ABNORMAL LOW (ref 3.87–5.11)
RDW: 19.7 % — ABNORMAL HIGH (ref 11.5–15.5)
WBC: 12.7 10*3/uL — ABNORMAL HIGH (ref 4.0–10.5)
nRBC: 0.2 % (ref 0.0–0.2)

## 2019-07-25 NOTE — Lactation Note (Signed)
This note was copied from a baby's chart. Lactation Consultation Note  Patient Name: Connie Mccarty [id number] Date: 07/25/2019 Reason for consult: Follow-up assessment;Mother's request;Difficult latch P1, 16 hour female infant. Tools given: DEBP and 20 mm NS due to mom having flat nipples that are very compressible and infant not sustaining latch while breastfeeding.  Per mom, infant was given formula at 0100 am in the morning. Mom's current feeding choice is breast and formula. Per mom, infant not been latching well nor sustaining latch while breastfeeding.  LC entered room infant very irritable, had mom do some STS with infant,  Mom hand expressed 8 mls of colostrum that was put in curve tip syringe, mom fitted with 20 mm NS. Mom did breast stimulation prior to applying 20 mm NS to help evert nipple shaft out more, 20 mm NS pre-filled with 0.5 mls of EBM, after few attempts infant sustained latch on mom's right breast using the football hold, infant was supplement at breast with curve tip syringe taking a total of 8 mls of colostrum and breastfeeding for 15 minutes. Mom was pleased that infant latched and sustained latch. Mom will continue to breastfeed infant according to cues, 8 to 12 times within 24 hours and not exceed 3 hours without breastfeeding infant. Mom understands that 20 mm NS is for short term use and mom will try latch infant with out for few feeds.  Mom knows to use DEBP every 3 hours for 15 minutes to help establish milk supply and due to using a NS. Mom knows to call RN or LC if she has further questions, concerns or need assistance with latching infant at breast.  Mom will work towards breastfeeding infant at breast,  Pumping to help establish milk supply  and delaying formula use.  Maternal Data    Feeding Feeding Type: Breast Fed  LATCH Score Latch: Repeated attempts needed to sustain latch, nipple held in mouth throughout feeding, stimulation needed to elicit  sucking reflex.  Audible Swallowing: A few with stimulation  Type of Nipple: Flat  Comfort (Breast/Nipple): Soft / non-tender  Hold (Positioning): Assistance needed to correctly position infant at breast and maintain latch.  LATCH Score: 6  Interventions Interventions: Assisted with latch;Skin to skin;Support pillows;Adjust position;Breast compression;Position options;Breast massage;Hand express;Expressed milk;DEBP  Lactation Tools Discussed/Used     Consult Status Consult Status: Follow-up Date: 07/25/19 Follow-up type: In-patient    Danelle Earthly 07/25/2019, 6:29 AM

## 2019-07-25 NOTE — Lactation Note (Signed)
This note was copied from a baby's chart. Lactation Consultation Note  Patient Name: Connie Mccarty Date: 07/25/2019 Reason for consult: Follow-up assessment;1st time breastfeeding;Primapara;Early term 63-38.6wks Baby 30hrs old, wt loss 2%. Mom sitting in bed ordering dinner, dad on couch holding sleeping baby, maternal grandmother at bedside. Mom reports breastfeeding going well, states it takes a while to get baby latched, after latching baby falls asleep minutes later. Mom's goal is to breastfeed until baby starts getting teeth. Mom with short erect nipples bilat free of damage. Baby sucked on LC finger, tongue thrusting and chomping noted, rhythmic sucks noted after offering EBM via syringe. Mom and LC unsuccessful latched baby to breast. Mom fitted for 66mm nipple shield, baby latched to right breast football hold with assistance, audible swallows noted, baby relaxed at breast, gave ~75ml EBM via curved tip syringe. Reinforced use NS with each feeding and positioning, cue based feeding, wake if last feeding >3hrs during day or >4hrs at night, optimal skin to skin and benefits, engorgement and how to manage, BF brochure with numbers for [facility] advice/ outpatient appointment, and pumping frequency. Mom voiced understanding, and with no further concerns. Advised to call for [facility] support if with difficulty latching. Left the room with baby latched to right breast at mark, dad at bedside. BGilliam, RN, IBCLC  Maternal Data Has patient been taught Hand Expression?: Yes Does the patient have breastfeeding experience prior to this delivery?: No  Feeding Feeding Type: Breast Fed  LATCH Score Latch: Repeated attempts needed to sustain latch, nipple held in mouth throughout feeding, stimulation needed to elicit sucking reflex.  Audible Swallowing: A few with stimulation  Type of Nipple: Everted at rest and after stimulation  Comfort (Breast/Nipple): Soft / non-tender  Hold  (Positioning): Assistance needed to correctly position infant at breast and maintain latch.  LATCH Score: 7  Interventions Interventions: Assisted with latch;Skin to skin;Breast compression;Hand express;Adjust position;Support pillows;Position options;Expressed milk  Lactation Tools Discussed/Used Nipple shield size: 24 WIC Program: Yes   Consult Status Consult Status: Follow-up Date: 07/26/19 Follow-up type: In-patient    Connie Mccarty 07/25/2019, 8:14 PM

## 2019-07-25 NOTE — Progress Notes (Signed)
Subjective: Postpartum Day 1: Cesarean Delivery Patient reports incisional pain and tolerating PO.  Nl lochia, pain controlled  Objective: Vital signs in last 24 hours: Temp:  [98.1 F (36.7 C)-99.9 F (37.7 C)] 98.1 F (36.7 C) (06/09 0500) Pulse Rate:  [91-111] 103 (06/09 0500) Resp:  [16-25] 18 (06/09 0500) BP: (98-152)/(58-93) 111/58 (06/09 0500) SpO2:  [95 %-100 %] 99 % (06/09 0500)  Physical Exam:  General: alert and no distress Lochia: appropriate Uterine Fundus: firm Incision: healing well DVT Evaluation: No evidence of DVT seen on physical exam.  Recent Labs    07/23/19 0114 07/24/19 0221  HGB 12.4 13.2  HCT 38.1 40.7    Assessment/Plan: Status post Cesarean section. Doing well postoperatively.  Continue current care.  Connie Mccarty 07/25/2019, 9:04 AM

## 2019-07-26 LAB — SURGICAL PATHOLOGY

## 2019-07-26 MED ORDER — IBUPROFEN 800 MG PO TABS: 800.0000 mg | ORAL_TABLET | Freq: Three times a day (TID) | ORAL | 1 refills | Status: DC | PRN

## 2019-07-26 MED ORDER — OXYCODONE-ACETAMINOPHEN 5-325 MG PO TABS: 1.0000 | ORAL_TABLET | ORAL | 0 refills | Status: DC | PRN

## 2019-07-26 MED ORDER — NIFEDIPINE ER OSMOTIC RELEASE 60 MG PO TB24
60.0000 mg | ORAL_TABLET | Freq: Every day | ORAL | 1 refills | Status: DC
Start: 2019-07-26 — End: 2023-09-28

## 2019-07-26 NOTE — Progress Notes (Signed)
Subjective: Postpartum Day 2: Cesarean Delivery Patient reports tolerating PO, + flatus and no problems voiding.  Lochia mild. Denies HA, SOB, CP, visual changes or lightheadedness. She is ambulating well. Bonding well with baby. SHe would like discharge to home today if possible  Objective: Vital signs in last 24 hours: Temp:  [97.7 F (36.5 C)-99.1 F (37.3 C)] 97.7 F (36.5 C) (06/10 0549) Pulse Rate:  [83-105] 98 (06/10 1000) Resp:  [18] 18 (06/10 0549) BP: (104-121)/(68-86) 121/86 (06/10 1000) SpO2:  [97 %] 97 % (06/09 2206)  Physical Exam:  General: alert, cooperative and no distress Lochia: appropriate Uterine Fundus: firm Incision: no significant drainage DVT Evaluation: No evidence of DVT seen on physical exam. Calf/Ankle edema is present.  Recent Labs    07/24/19 0221 07/25/19 0648  HGB 13.2 9.0*  HCT 40.7 27.8*    Assessment/Plan: Status post Cesarean section. Doing well postoperatively.  Discharge home with standard precautions and return to clinic 1 week for BP check, 2 weeks for incision check and 6 weeks for postpartum visit  Connie Mccarty 07/26/2019, 1:26 PM

## 2019-07-26 NOTE — Discharge Instructions (Signed)
Call office with any concerns [phone] 

## 2019-07-26 NOTE — Progress Notes (Signed)
Patient ID: Connie Mccarty, female   DOB: [birth year], 29 y.o.   MRN: [id number]  Per pediatrician, pt to be monitored overnight given low grade temps noted  Cancel mother discharge order

## 2019-07-26 NOTE — Discharge Summary (Addendum)
Postpartum Discharge Summary  Date of Service updated      Patient Name: Connie Mccarty DOB: February 21, 1990 MRN: [id number]  Date of admission: 07/23/2019 Delivery date:07/24/2019  Delivering provider: Deliah Boston  Date of discharge: 07/27/2019  Admitting diagnosis: Pre-existing hypertension affecting pregnancy in third trimester [O10.913] Intrauterine pregnancy: [redacted]w[redacted]d    Secondary diagnosis:  Active Problems:   Pre-existing hypertension affecting pregnancy in third trimester  Additional problems: none    Discharge diagnosis: Term Pregnancy Delivered, Gestational Hypertension and Preeclampsia (mild)                                              Post partum procedures:n/a Augmentation: AROM, Pitocin and Cytotec Complications: Intrauterine Inflammation or infection (Chorioamniotis)  Hospital course: Induction of Labor With Cesarean Section   29y.o. yo G1P1001 at 382w3das admitted to the hospital 07/23/2019 for induction of labor. Patient had a labor course significant for protracted labor with chorioamnionitis remote from delivery. The patient went for cesarean section due to chorioamnionitis. Delivery details are as follows: Membrane Rupture Time/Date: 8:19 PM ,07/23/2019   Delivery Method:C-Section, Low Transverse  Details of operation can be found in separate operative Note.  Patient had an uncomplicated postpartum course. She is ambulating, tolerating a regular diet, passing flatus, and urinating well.  Patient is discharged home in stable condition on 07/27/19.      Newborn Data: Birth date:07/24/2019  Birth time:1:38 PM  Gender:Female  Living status:Living  Apgars:6 ,7  Weight:2875 g                                 Magnesium Sulfate received: Yes: Seizure prophylaxis BMZ received: Yes Rhophylac:N/A MMR:N/A T-DaP:Given prenatally Flu: N/A Transfusion:No  Physical exam  Vitals:   07/26/19 1000 07/26/19 1445 07/26/19 2116 07/27/19 0519  BP: 121/86 129/87 132/85  121/81  Pulse: 98 96 97 88  Resp:  '18 18 18  ' Temp:  97.9 F (36.6 C) 98.6 F (37 C) 98.2 F (36.8 C)  TempSrc:  Oral Oral Oral  SpO2:  100%  97%  Weight:      Height:       General: alert, cooperative and no distress Lochia: appropriate Uterine Fundus: firm Incision: Dressing is clean, dry, and intact DVT Evaluation: No evidence of DVT seen on physical exam. Labs: Lab Results  Component Value Date   WBC 12.7 (H) 07/25/2019   HGB 9.0 (L) 07/25/2019   HCT 27.8 (L) 07/25/2019   MCV 89.1 07/25/2019   PLT 197 07/25/2019   CMP Latest Ref Rng & Units 07/23/2019  Glucose 70 - 99 mg/dL 151(H)  BUN 6 - 20 mg/dL 10  Creatinine 0.44 - 1.00 mg/dL 0.58  Sodium 135 - 145 mmol/L 136  Potassium 3.5 - 5.1 mmol/L 4.0  Chloride 98 - 111 mmol/L 107  CO2 22 - 32 mmol/L 18(L)  Calcium 8.9 - 10.3 mg/dL 9.2  Total Protein 6.5 - 8.1 g/dL 6.6  Total Bilirubin 0.3 - 1.2 mg/dL 0.1(L)  Alkaline Phos 38 - 126 U/L 193(H)  AST 15 - 41 U/L 21  ALT 0 - 44 U/L 17   Edinburgh Score: Edinburgh Postnatal Depression Scale Screening Tool 07/25/2019  I have been able to laugh and see the funny side of things. 0  I have looked  forward with enjoyment to things. 0  I have blamed myself unnecessarily when things went wrong. 1  I have been anxious or worried for no good reason. 2  I have felt scared or panicky for no good reason. 1  Things have been getting on top of me. 0  I have been so unhappy that I have had difficulty sleeping. 0  I have felt sad or miserable. 0  I have been so unhappy that I have been crying. 0  The thought of harming myself has occurred to me. 0  Edinburgh Postnatal Depression Scale Total 4      After visit meds:  Allergies as of 07/27/2019   No Known Allergies     Medication List    STOP taking these medications   aspirin 81 MG chewable tablet   Blood Pressure Kit   Blood Pressure Monitor/M Cuff Misc     TAKE these medications   ferrous sulfate 324 MG Tbec Take 324 mg  by mouth.   ibuprofen 800 MG tablet Commonly known as: ADVIL Take 1 tablet (800 mg total) by mouth every 8 (eight) hours as needed for cramping.   labetalol 100 MG tablet Commonly known as: NORMODYNE Take 1 tablet (100 mg total) by mouth 2 (two) times daily.   NIFEdipine 60 MG 24 hr tablet Commonly known as: PROCARDIA XL/NIFEDICAL XL Take 1 tablet (60 mg total) by mouth daily.   oxyCODONE-acetaminophen 5-325 MG tablet Commonly known as: Percocet Take 1 tablet by mouth every 4 (four) hours as needed for severe pain.   prenatal multivitamin Tabs tablet Take 1 tablet by mouth daily at 12 noon.            Discharge Care Instructions  (From admission, onward)         Start     Ordered   07/26/19 0000  If the dressing is still on your incision site when you go home, remove it on the third day after your surgery date. Remove dressing if it begins to fall off, or if it is dirty or damaged before the third day.        07/26/19 1333           Discharge home in stable condition Infant Feeding: Breast Infant Disposition:home with mother Discharge instruction: per After Visit Summary and Postpartum booklet. Activity: Advance as tolerated. Pelvic rest for 6 weeks.  Diet: low salt diet Anticipated Birth Control: Unsure Postpartum Appointment: Return to clinic 1 week for BP check, 2 weeks for incision check and 6 weeks for postpartum visit Additional Postpartum F/U: Incision check 2 weeks and BP check 1 week Future Appointments:No future appointments. Follow up Visit:  Follow-up Information    Jazlene Bares, Melida Quitter, MD. Schedule an appointment as soon as possible for a visit.   Specialty: Obstetrics and Gynecology Why:  return to clinic 1 week for BP check, 2 weeks for incision check and 6 weeks for postpartum visit  Contact information: [address] [address] [phone]                   07/27/2019 Deliah Boston, MD

## 2019-07-27 ENCOUNTER — Ambulatory Visit: Payer: Self-pay

## 2019-07-27 NOTE — Lactation Note (Signed)
This note was copied from a baby's chart. Lactation Consultation Note  Patient Name: Connie Mccarty [id number] Date: 07/27/2019  .  Assist mother with breastfeeding infant . Infant latched on well. Assist mother with breast compression. .  Mother is supplementing infant.   Mother was observed with infant latched on at the rt breast. Observed infant suckling with audible swallows. Infant sustained latch for 10 mins.  Discussed treatment and prevention of engorgement.  Plan of Care : Breastfeed infant with feeding cues Supplement infant with ebm/formula, according to supplemental guidelines. Pump using a DEBP after each feeding for 15-20 mins.   Mother to continue to cue base feed infant and feed at least 8-12 times or more in 24 hours and advised to allow for cluster feeding infant as needed.   Mother to continue to due STS. Mother is aware of available LC services at [facility], BFSG'S, OP Dept, and phone # for questions or concerns about breastfeeding.  Mother receptive to all teaching and plan of care.       Maternal Data    Feeding Feeding Type: Breast Fed  LATCH Score                   Interventions    Lactation Tools Discussed/Used     Consult Status      Michel Bickers 07/27/2019, 3:44 PM

## 2019-07-27 NOTE — Progress Notes (Signed)
Subjective: Postpartum Day 3: Cesarean Delivery Patient reports tolerating PO, + flatus and no problems voiding.  Lochia mild. Denies HA, SOB, CP, visual changes or lightheadedness. She is ambulating well. Bonding well with baby.   Objective: Vital signs in last 24 hours: Temp:  [97.9 F (36.6 C)-98.6 F (37 C)] 98.2 F (36.8 C) (06/11 0519) Pulse Rate:  [88-98] 88 (06/11 0519) Resp:  [18] 18 (06/11 0519) BP: (121-132)/(81-87) 121/81 (06/11 0519) SpO2:  [97 %-100 %] 97 % (06/11 0519)  Physical Exam:  General: alert, cooperative and no distress Lochia: appropriate Uterine Fundus: firm Incision: no significant drainage DVT Evaluation: No evidence of DVT seen on physical exam. Calf/Ankle edema is present.  Recent Labs    07/25/19 0648  HGB 9.0*  HCT 27.8*    Assessment/Plan: Status post Cesarean section. Doing well postoperatively.  Discharge home with standard precautions and return to clinic 1 week for BP check, 2 weeks for incision check and 6 weeks for postpartum visit  Carlisle Cater 07/27/2019, 8:31 AM

## 2019-08-01 ENCOUNTER — Encounter (HOSPITAL_COMMUNITY): Payer: Self-pay | Admitting: Obstetrics and Gynecology

## 2019-08-01 NOTE — Addendum Note (Signed)
Addendum  created 08/01/19 1527 by Trevor Iha, MD   Intraprocedure Event edited

## 2022-03-28 IMAGING — US US MFM FETAL BPP W/O NON-STRESS
1 series · 15 of 28 positions shown · non-contrast
Comparison: none

[Series 1: us mfm fetal bpp w/o non-stress · 39 acquisitions, 15 frames shown]
[im 1/39]
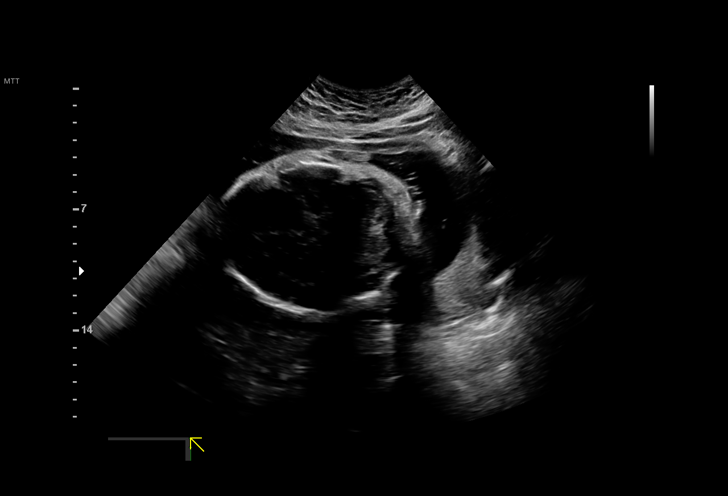
[im 3/39]
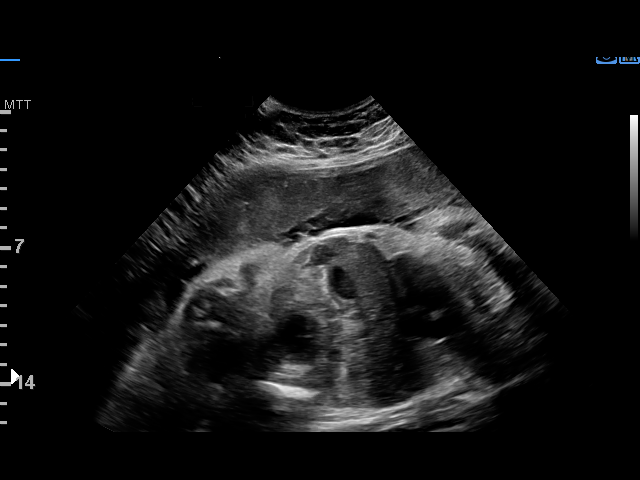
[im 6/39]
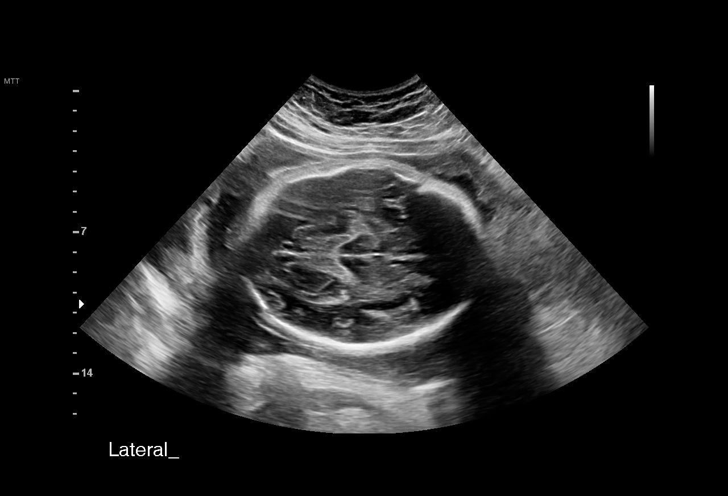
[im 9/39]
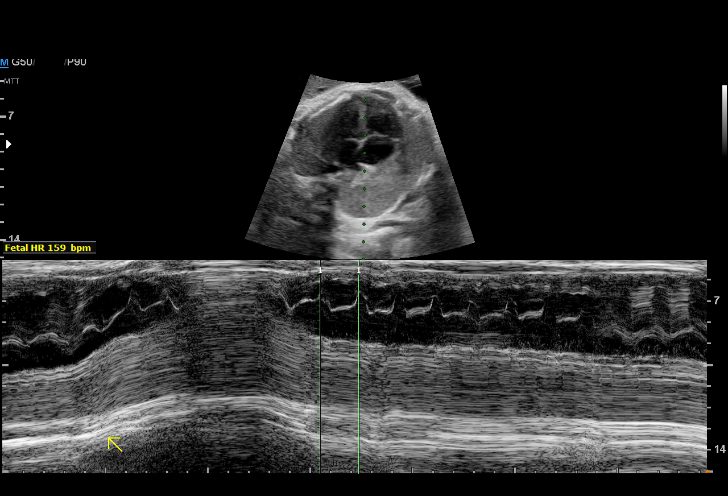
[im 12/39]
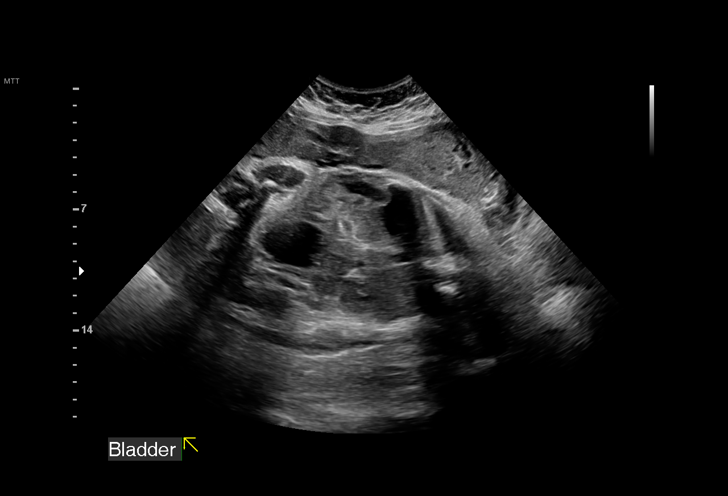
[im 15/39]
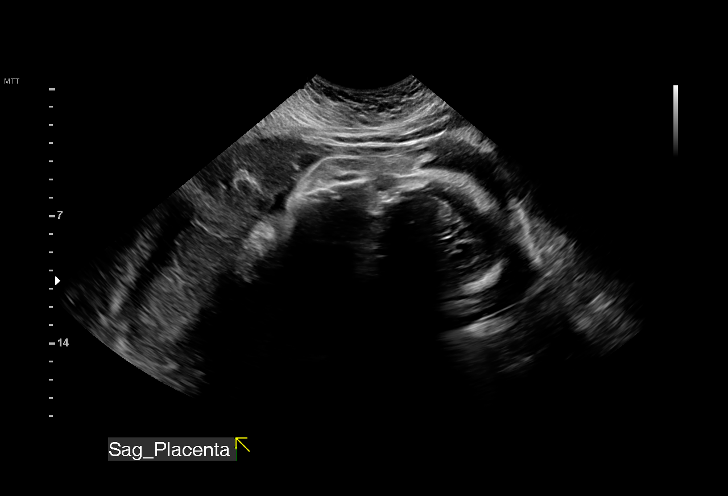
[im 17/39]
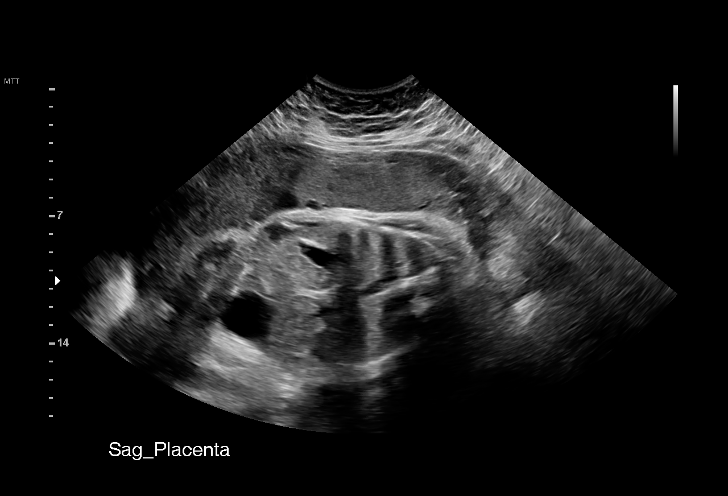
[im 20/39]
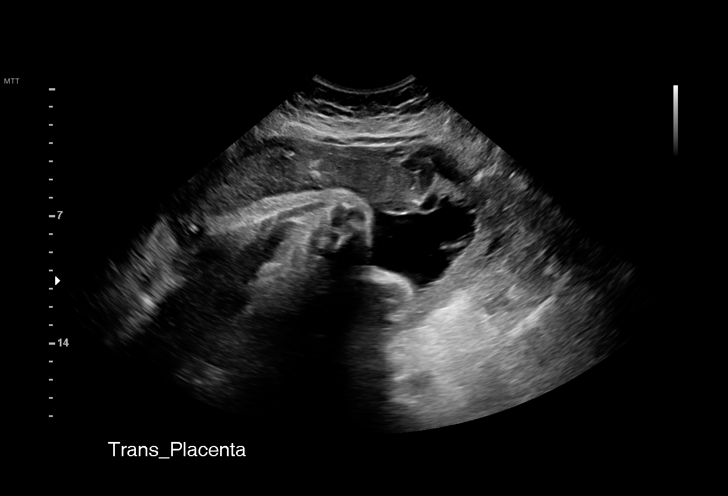
[im 22/39]
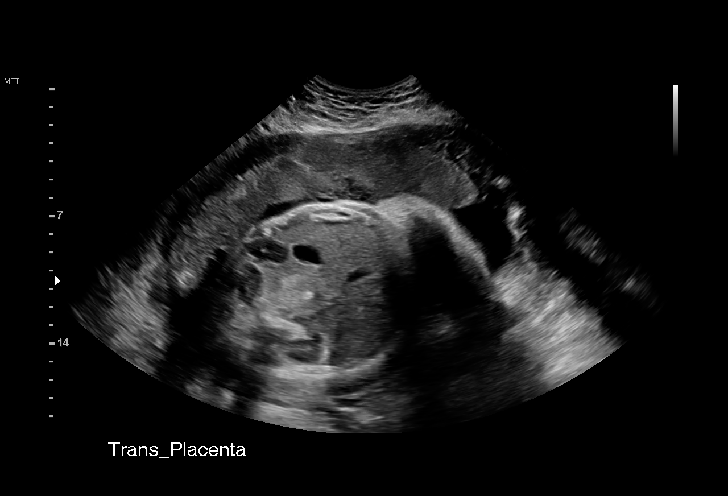
[im 24/39]
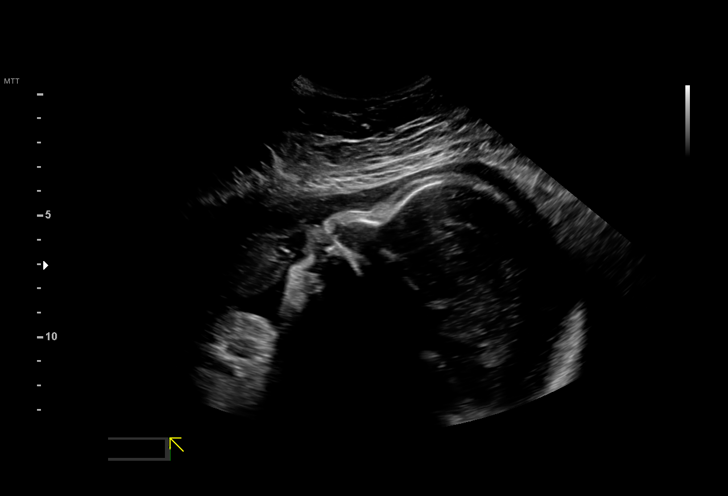
[im 27/39]
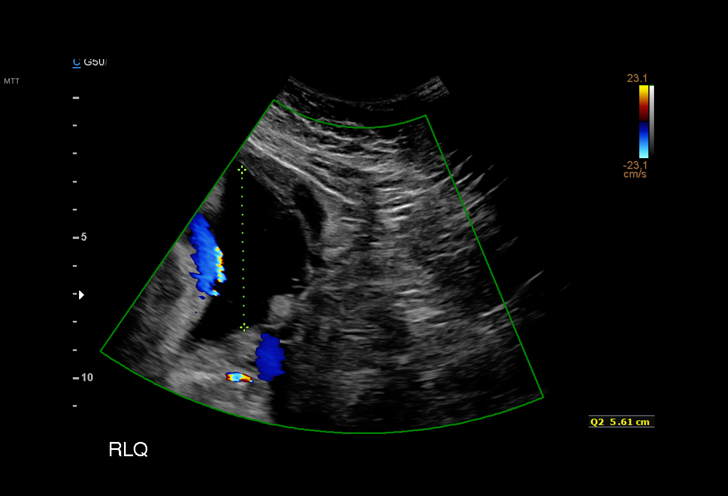
[im 30/39]
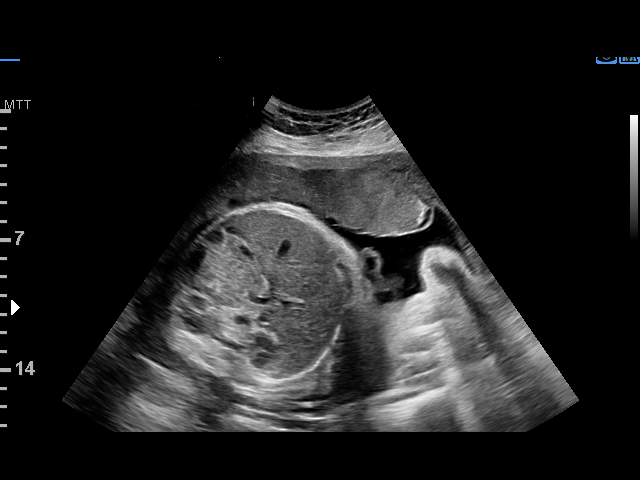
[im 33/39]
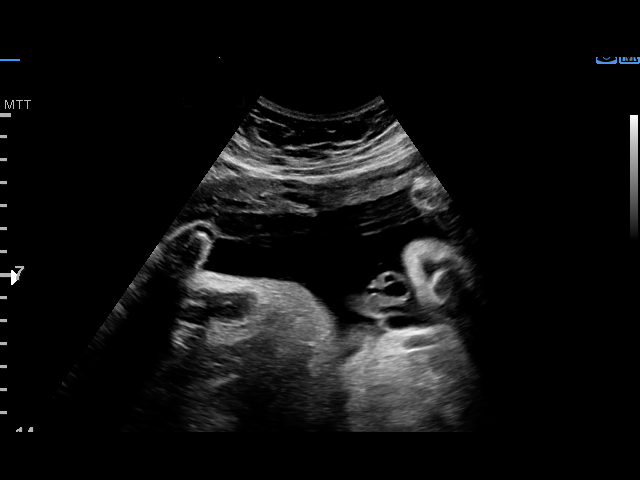
[im 36/39]
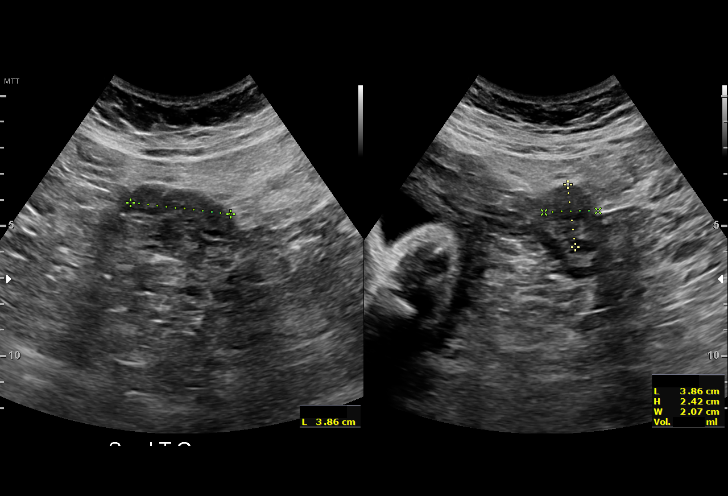
[im 39/39]
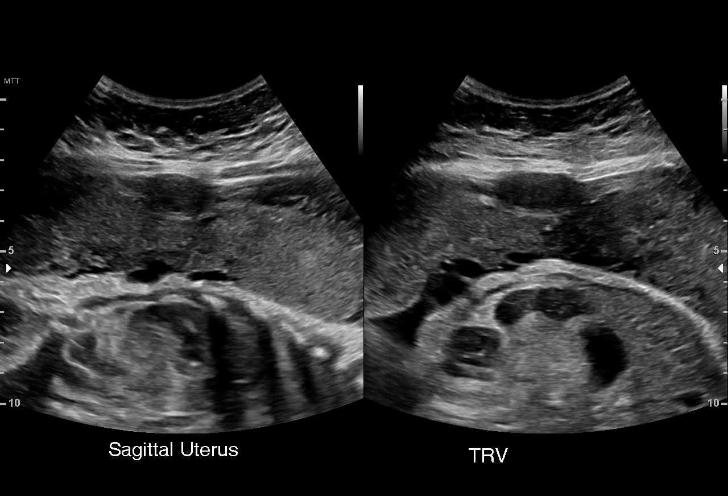

[15 of 28 positions shown; findings below may reference images not displayed]

Attending:        Nagoya Alek        Tertiary Phy.:    [facility]
                                                            #[phone]
 Referred By:      [facility] MAU/Triage         [facility]         [facility]
 Secondary Phy[facility]   [facility]
                   YATI

Indications

 34 weeks gestation of pregnancy
 Hypertension - Chronic/Pre-existing
 Elevated blood pressure affecting pregnancy
 in third trimester
Fetal Evaluation

 Num Of Fetuses:         1
 Fetal Heart Rate(bpm):  159
 Cardiac Activity:       Observed
 Presentation:           Cephalic Oblique, Head in Mat LLQ
 Placenta:               Anterior
 P. Cord Insertion:      Not well visualized

 Amniotic Fluid
 AFI FV:      Within normal limits

 AFI Sum(cm)     %Tile       Largest Pocket(cm)
 16.7            61

 RUQ(cm)       RLQ(cm)       LUQ(cm)        LLQ(cm)

Biophysical Evaluation

 Amniotic F.V:   Pocket => 2 cm             F. Tone:        Observed
 F. Movement:    Observed                   Score:          [DATE]
 F. Breathing:   Observed
OB History

 Blood Type:   O+
 Gravidity:    1
Gestational Age

 LMP:           34w 3d        Date:  11/04/18                 EDD:   08/11/19
 Best:          34w 3d     Det. By:  LMP  (11/04/18)          EDD:   08/11/19
Cervix Uterus Adnexa

 Cervix
 Not visualized (advanced GA >94wks)

 Uterus
 Single fibroid noted, see table below.

 Right Ovary
 Within normal limits. No adnexal mass visualized.

 Left Ovary
 Within normal limits. No adnexal mass visualized.

 Cul De Sac
 No free fluid seen.

 Adnexa
 No abnormality visualized.
Myomas

 Site                     L(cm)      W(cm)      D(cm)       Location
 Anterior                 2.2        1.4        2.8         Subserosal

 Blood Flow                  RI       PI       Comments

Impression

 Patient was evaluated at the [facility] with increased blood
 pressures.
 Amniotic fluid is normal and good fetal activity is seen
 .Antenatal testing is reassuring. BPP [DATE].
                 [place], [place]

## 2023-02-28 LAB — OB RESULTS CONSOLE ANTIBODY SCREEN: Antibody Screen: NEGATIVE

## 2023-03-02 DIAGNOSIS — E66812 Obesity, class 2: Secondary | ICD-10-CM | POA: Insufficient documentation

## 2023-04-01 LAB — OB RESULTS CONSOLE RPR: RPR: NONREACTIVE

## 2023-04-01 LAB — OB RESULTS CONSOLE HIV ANTIBODY (ROUTINE TESTING): HIV: NONREACTIVE

## 2023-04-01 LAB — OB RESULTS CONSOLE HEPATITIS B SURFACE ANTIGEN: Hepatitis B Surface Ag: NEGATIVE

## 2023-04-01 LAB — OB RESULTS CONSOLE GC/CHLAMYDIA
Chlamydia: NEGATIVE
Neisseria Gonorrhea: NEGATIVE

## 2023-04-01 LAB — OB RESULTS CONSOLE RUBELLA ANTIBODY, IGM: Rubella: IMMUNE

## 2023-06-16 ENCOUNTER — Other Ambulatory Visit: Payer: Self-pay

## 2023-06-16 ENCOUNTER — Other Ambulatory Visit: Payer: Self-pay | Admitting: Obstetrics

## 2023-06-16 DIAGNOSIS — Z362 Encounter for other antenatal screening follow-up: Secondary | ICD-10-CM

## 2023-06-30 NOTE — Progress Notes (Signed)
 Patient was seen on 07/06/2023 for Gestational Diabetes self-management class at the [facility]. The following learning objectives were met by the patient during this course:  States the definition of Gestational Diabetes States why dietary management is important in controlling blood glucose Describes the effects each nutrient has on blood glucose levels Demonstrates ability to create a balanced meal plan Demonstrates carbohydrate counting  States when to check blood glucose levels Demonstrates proper blood glucose monitoring techniques States the effect of stress and exercise on blood glucose levels States the importance of limiting caffeine and abstaining from alcohol and smoking    Patient has a meter prior to visit. Patient is instructed to continue testing pre breakfast and 2 hours after each meal. Blood glucose today in class 95 mg/dL, reported as 2 hour post prandial per Pt  Patient instructed to monitor glucose levels: QID FBS: 60 - <90 1 hour: <140 2 hour: <120  *Patient received handouts: Nutrition Diabetes and Pregnancy Carbohydrate Counting List Blood glucose log Snack ideas for diabetes during pregnancy  Patient will be seen for follow-up as needed.

## 2023-07-06 ENCOUNTER — Encounter: Payer: Self-pay | Attending: Obstetrics and Gynecology | Admitting: Dietician

## 2023-07-06 DIAGNOSIS — O99810 Abnormal glucose complicating pregnancy: Secondary | ICD-10-CM

## 2023-07-19 ENCOUNTER — Ambulatory Visit (HOSPITAL_BASED_OUTPATIENT_CLINIC_OR_DEPARTMENT_OTHER): Admitting: Obstetrics and Gynecology

## 2023-07-19 ENCOUNTER — Ambulatory Visit: Attending: Obstetrics and Gynecology

## 2023-07-19 VITALS — BP 139/84 | HR 105

## 2023-07-19 DIAGNOSIS — Z362 Encounter for other antenatal screening follow-up: Secondary | ICD-10-CM | POA: Diagnosis not present

## 2023-07-19 DIAGNOSIS — O10012 Pre-existing essential hypertension complicating pregnancy, second trimester: Secondary | ICD-10-CM

## 2023-07-19 DIAGNOSIS — Z3A26 26 weeks gestation of pregnancy: Secondary | ICD-10-CM | POA: Insufficient documentation

## 2023-07-19 DIAGNOSIS — O10912 Unspecified pre-existing hypertension complicating pregnancy, second trimester: Secondary | ICD-10-CM | POA: Insufficient documentation

## 2023-07-19 DIAGNOSIS — O34219 Maternal care for unspecified type scar from previous cesarean delivery: Secondary | ICD-10-CM | POA: Diagnosis not present

## 2023-07-19 NOTE — Progress Notes (Signed)
 Maternal-Fetal Medicine Consultation Name: Connie Mccarty MRN: [id number]  G2 P1001 at 26w 3d gestation.  Patient is here for fetal anatomy scan.  Fetal anatomical survey could not be completed at your office. On cell-free fetal DNA screening, the risks of fetal aneuploidies are not increased. Patient has chronic hypertension, and she takes nifedipine  XL 90 mg daily.  Hypertension is reportedly well-controlled on medications.  Blood pressure today at our office is 139/84 mmHg.  She does not have diabetes. Obstetrical history significant for a term cesarean delivery in 2021 of a female infant weighing 6 pounds and 5 ounces at birth.  Cesarean section was performed because of arrest of dilation.  Ultrasound We performed a fetal anatomical survey.  Amniotic fluid is normal and good fetal activity seen.  Fetal growth is appropriate for gestational.  No markers of aneuploidies or obvious fetal structural defects are seen. Placenta is posterior and there is no evidence of previa or placenta accreta spectrum.  Two small intramural myomas are seen (measurement in the ultrasound report). Patient understands the limitations of ultrasound in detecting fetal anomalies.  Our concerns include: Chronic hypertension -Adverse outcomes of severe chronic hypertension include maternal stroke, endorgan damage, coagulation disturbances. Placental abruption is more common. -Superimposed preeclampsia occurs in more than 30% of women with chronic hypertension I discussed the benefit of low-dose aspirin  prophylaxis that helps delaying or preventing preeclampsia. -I discussed the safety profile of antihypertensives.  Nifedipine  can be safely given in pregnancy.  Alternative medications include labetalol . -I discussed ultrasound protocol of monitoring fetal growth assessment and antenatal testing. -Timing of delivery: Provided her blood pressures are well controlled, she can be delivered at 39 weeks' gestation.  Early  term delivery is an option if hypertension is not well controlled.  Previous cesarean delivery We discussed vaginal birth after cesarean delivery.  Patient had cesarean delivery for arrest of dilation.  She has chronic hypertension.  Both these factors reduce the likelihood of successful vaginal delivery.  Based on maternal-fetal medicine network calculator, the likelihood for successful vaginal delivery is only 15%. Patient was counseled that repeat cesarean deliveries increase the risks of placenta previa and or placenta accreta spectrum. I reassured the patient of normal location of placenta and that there is no evidence of previa or placenta accreta spectrum.  Recommendations - Follow-up ultrasound may be performed at your office. - I recommend serial fetal growth assessments. - Weekly BPP from [redacted] weeks gestation until delivery. - Mode of delivery counseling at your office.  Consultation including face-to-face (more than 50%) counseling 30 minutes.

## 2023-08-18 ENCOUNTER — Other Ambulatory Visit: Payer: Self-pay | Admitting: Obstetrics and Gynecology

## 2023-09-19 ENCOUNTER — Telehealth (HOSPITAL_COMMUNITY): Payer: Self-pay | Admitting: *Deleted

## 2023-09-19 NOTE — Telephone Encounter (Signed)
 Preadmission screen

## 2023-09-19 NOTE — Patient Instructions (Signed)
 Connie Mccarty  09/19/2023   Your procedure is scheduled on:  10/03/2023  Arrive at 1030 at Entrance C on [employer] [facility]  and [employer]. You are invited to use the FREE valet parking or use the Visitor's parking deck.  Pick up the phone at the desk and dial [phone].  Call this number if you have problems the morning of surgery: [phone]  Remember:   Do not eat food:(After Midnight) Desps de medianoche.  You may drink clear liquids until  ___0830__.  Clear liquids means a liquid you can see thru.  It can have color such as Cola or Kool aid.  Tea is OK and coffee as long as no milk or creamer of any kind.  Take these medicines the morning of surgery with A SIP OF WATER :  none   Do not wear jewelry, make-up or nail polish.  Do not wear lotions, powders, or perfumes. Do not wear deodorant.  Do not shave 48 hours prior to surgery.  Do not bring valuables to the hospital.  [facility] is not   responsible for any belongings or valuables brought to the hospital.  Contacts, dentures or bridgework may not be worn into surgery.  Leave suitcase in the car. After surgery it may be brought to your room.  For patients admitted to the hospital, checkout time is 11:00 AM the day of              discharge.      Please read over the following fact sheets that you were given:     Preparing for Surgery

## 2023-09-20 ENCOUNTER — Encounter (HOSPITAL_COMMUNITY): Payer: Self-pay

## 2023-09-28 ENCOUNTER — Inpatient Hospital Stay (HOSPITAL_COMMUNITY)
Admission: AD | Admit: 2023-09-28 | Discharge: 2023-10-02 | DRG: 787 | Disposition: A | Discharge status: Home / Self Care | Attending: Obstetrics and Gynecology | Admitting: Obstetrics and Gynecology

## 2023-09-28 ENCOUNTER — Encounter (HOSPITAL_COMMUNITY): Payer: Self-pay | Admitting: Obstetrics and Gynecology

## 2023-09-28 ENCOUNTER — Encounter (HOSPITAL_COMMUNITY)
Admission: AD | Disposition: A | Discharge status: Home / Self Care | Payer: Self-pay | Source: Home / Self Care | Attending: Obstetrics and Gynecology

## 2023-09-28 DIAGNOSIS — Z833 Family history of diabetes mellitus: Secondary | ICD-10-CM | POA: Diagnosis not present

## 2023-09-28 DIAGNOSIS — O1413 Severe pre-eclampsia, third trimester: Secondary | ICD-10-CM | POA: Diagnosis not present

## 2023-09-28 DIAGNOSIS — O1092 Unspecified pre-existing hypertension complicating childbirth: Secondary | ICD-10-CM | POA: Diagnosis present

## 2023-09-28 DIAGNOSIS — O114 Pre-existing hypertension with pre-eclampsia, complicating childbirth: Secondary | ICD-10-CM | POA: Diagnosis present

## 2023-09-28 DIAGNOSIS — O24420 Gestational diabetes mellitus in childbirth, diet controlled: Secondary | ICD-10-CM | POA: Diagnosis present

## 2023-09-28 DIAGNOSIS — O1410 Severe pre-eclampsia, unspecified trimester: Secondary | ICD-10-CM | POA: Diagnosis present

## 2023-09-28 DIAGNOSIS — O9081 Anemia of the puerperium: Secondary | ICD-10-CM | POA: Diagnosis not present

## 2023-09-28 DIAGNOSIS — Z98891 History of uterine scar from previous surgery: Principal | ICD-10-CM

## 2023-09-28 DIAGNOSIS — D62 Acute posthemorrhagic anemia: Secondary | ICD-10-CM | POA: Diagnosis not present

## 2023-09-28 DIAGNOSIS — Z8249 Family history of ischemic heart disease and other diseases of the circulatory system: Secondary | ICD-10-CM

## 2023-09-28 DIAGNOSIS — O34211 Maternal care for low transverse scar from previous cesarean delivery: Secondary | ICD-10-CM | POA: Diagnosis present

## 2023-09-28 DIAGNOSIS — Z3A36 36 weeks gestation of pregnancy: Secondary | ICD-10-CM | POA: Diagnosis not present

## 2023-09-28 DIAGNOSIS — O99214 Obesity complicating childbirth: Secondary | ICD-10-CM | POA: Diagnosis present

## 2023-09-28 LAB — CBC
HCT: 41.2 % (ref 36.0–46.0)
Hemoglobin: 13.5 g/dL (ref 12.0–15.0)
MCH: 29.4 pg (ref 26.0–34.0)
MCHC: 32.8 g/dL (ref 30.0–36.0)
MCV: 89.8 fL (ref 80.0–100.0)
Platelets: 272 K/uL (ref 150–400)
RBC: 4.59 MIL/uL (ref 3.87–5.11)
RDW: 15.7 % — ABNORMAL HIGH (ref 11.5–15.5)
WBC: 10.2 K/uL (ref 4.0–10.5)
nRBC: 0.2 % (ref 0.0–0.2)

## 2023-09-28 LAB — COMPREHENSIVE METABOLIC PANEL WITH GFR
ALT: 13 U/L (ref 0–44)
AST: 21 U/L (ref 15–41)
Albumin: 2.7 g/dL — ABNORMAL LOW (ref 3.5–5.0)
Alkaline Phosphatase: 178 U/L — ABNORMAL HIGH (ref 38–126)
Anion gap: 10 (ref 5–15)
BUN: 12 mg/dL (ref 6–20)
CO2: 17 mmol/L — ABNORMAL LOW (ref 22–32)
Calcium: 9.3 mg/dL (ref 8.9–10.3)
Chloride: 109 mmol/L (ref 98–111)
Creatinine, Ser: 0.58 mg/dL (ref 0.44–1.00)
GFR, Estimated: >60 mL/min (ref >60)
Glucose, Bld: 88 mg/dL (ref 70–99)
Potassium: 4 mmol/L (ref 3.5–5.1)
Sodium: 136 mmol/L (ref 135–145)
Total Bilirubin: 0.2 mg/dL (ref 0.0–1.2)
Total Protein: 6.7 g/dL (ref 6.5–8.1)

## 2023-09-28 SURGERY — Surgical Case
Anesthesia: Spinal

## 2023-09-28 MED ORDER — LACTATED RINGERS IV SOLN: INTRAVENOUS | Status: DC

## 2023-09-28 MED ORDER — CEFAZOLIN SODIUM-DEXTROSE 2-4 GM/100ML-% IV SOLN
2.0000 g | INTRAVENOUS | Status: AC
  Administered 2023-09-29: 2 g via INTRAVENOUS

## 2023-09-28 MED ORDER — MORPHINE SULFATE (PF) 0.5 MG/ML IJ SOLN
INTRAMUSCULAR | Status: AC
  Filled 2023-09-28: qty 10

## 2023-09-28 MED ORDER — OXYTOCIN-SODIUM CHLORIDE 30-0.9 UT/500ML-% IV SOLN
INTRAVENOUS | Status: AC
  Filled 2023-09-28: qty 500

## 2023-09-28 MED ORDER — FENTANYL CITRATE (PF) 100 MCG/2ML IJ SOLN
INTRAMUSCULAR | Status: AC
  Filled 2023-09-28: qty 2

## 2023-09-28 MED ORDER — LACTATED RINGERS IV BOLUS
1000.0000 mL | Freq: Once | INTRAVENOUS | Status: AC
  Administered 2023-09-28 (×2): 1000 mL via INTRAVENOUS

## 2023-09-28 MED ORDER — LABETALOL HCL 5 MG/ML IV SOLN: 80.0000 mg | INTRAVENOUS | Status: DC | PRN

## 2023-09-28 MED ORDER — HYDRALAZINE HCL 20 MG/ML IJ SOLN: 10.0000 mg | INTRAMUSCULAR | Status: DC | PRN

## 2023-09-28 MED ORDER — PHENYLEPHRINE HCL-NACL 20-0.9 MG/250ML-% IV SOLN
INTRAVENOUS | Status: AC
  Filled 2023-09-28: qty 250

## 2023-09-28 MED ORDER — SOD CITRATE-CITRIC ACID 500-334 MG/5ML PO SOLN
30.0000 mL | Freq: Once | ORAL | Status: AC
  Administered 2023-09-28 (×2): 30 mL via ORAL
  Filled 2023-09-28: qty 30

## 2023-09-28 MED ORDER — LABETALOL HCL 5 MG/ML IV SOLN: 40.0000 mg | INTRAVENOUS | Status: DC | PRN

## 2023-09-28 MED ORDER — POVIDONE-IODINE 10 % EX SWAB: 2.0000 | Freq: Once | CUTANEOUS | Status: DC

## 2023-09-28 MED ORDER — CEFAZOLIN SODIUM-DEXTROSE 2-4 GM/100ML-% IV SOLN: 2.0000 g | INTRAVENOUS | Status: DC

## 2023-09-28 MED ORDER — ONDANSETRON HCL 4 MG/2ML IJ SOLN
INTRAMUSCULAR | Status: AC
  Filled 2023-09-28: qty 2

## 2023-09-28 MED ORDER — LABETALOL HCL 5 MG/ML IV SOLN
20.0000 mg | INTRAVENOUS | Status: DC | PRN
  Administered 2023-09-28 (×2): 20 mg via INTRAVENOUS
  Filled 2023-09-28: qty 4

## 2023-09-28 SURGICAL SUPPLY — 33 items
BENZOIN TINCTURE PRP APPL 2/3 (GAUZE/BANDAGES/DRESSINGS) ×1 IMPLANT
CHLORAPREP W/TINT 26 (MISCELLANEOUS) ×2 IMPLANT
CLAMP UMBILICAL CORD (MISCELLANEOUS) ×1 IMPLANT
CLOTH BEACON ORANGE TIMEOUT ST (SAFETY) ×1 IMPLANT
DERMABOND ADVANCED .7 DNX12 (GAUZE/BANDAGES/DRESSINGS) IMPLANT
DRESSING PREVENA PLUS CUSTOM (GAUZE/BANDAGES/DRESSINGS) IMPLANT
DRSG OPSITE POSTOP 4X10 (GAUZE/BANDAGES/DRESSINGS) ×1 IMPLANT
ELECTRODE REM PT RTRN 9FT ADLT (ELECTROSURGICAL) ×1 IMPLANT
EXTRACTOR VACUUM KIWI (MISCELLANEOUS) ×1 IMPLANT
GLOVE BIO SURGEON STRL SZ 6.5 (GLOVE) ×1 IMPLANT
GLOVE BIOGEL PI IND STRL 6.5 (GLOVE) ×1 IMPLANT
GOWN STRL REUS W/TWL LRG LVL3 (GOWN DISPOSABLE) ×2 IMPLANT
KIT ABG SYR 3ML LUER SLIP (SYRINGE) IMPLANT
KIT PREVENA INCISION MGT20CM45 (CANNISTER) IMPLANT
MAT PREVALON FULL STRYKER (MISCELLANEOUS) IMPLANT
NDL HYPO 25X5/8 SAFETYGLIDE (NEEDLE) IMPLANT
NEEDLE HYPO 25X5/8 SAFETYGLIDE (NEEDLE) IMPLANT
NS IRRIG 1000ML POUR BTL (IV SOLUTION) ×1 IMPLANT
PACK C SECTION WH (CUSTOM PROCEDURE TRAY) ×1 IMPLANT
PAD OB MATERNITY 4.3X12.25 (PERSONAL CARE ITEMS) ×1 IMPLANT
RETRACTOR TRAXI PANNICULUS (MISCELLANEOUS) IMPLANT
RETRACTOR WND ALEXIS 25 LRG (MISCELLANEOUS) ×1 IMPLANT
STRIP CLOSURE SKIN 1/2X4 (GAUZE/BANDAGES/DRESSINGS) IMPLANT
SUT MNCRL 0 VIOLET CTX 36 (SUTURE) ×2 IMPLANT
SUT PLAIN 2 0 XLH (SUTURE) IMPLANT
SUT VIC AB 0 CT1 36 (SUTURE) ×1 IMPLANT
SUT VIC AB 2-0 CT1 TAPERPNT 27 (SUTURE) ×1 IMPLANT
SUT VIC AB 3-0 CT1 TAPERPNT 27 (SUTURE) IMPLANT
SUT VIC AB 4-0 PS2 27 (SUTURE) ×1 IMPLANT
SUTURE PLAIN GUT 2.0 ETHICON (SUTURE) IMPLANT
TOWEL OR 17X24 6PK STRL BLUE (TOWEL DISPOSABLE) ×1 IMPLANT
TRAY FOLEY W/BAG SLVR 14FR LF (SET/KITS/TRAYS/PACK) IMPLANT
WATER STERILE IRR 1000ML POUR (IV SOLUTION) ×1 IMPLANT

## 2023-09-28 NOTE — MAU Note (Signed)
 Connie Mccarty is a 33 y.o. at [redacted]w[redacted]d here in MAU reporting: sent from office for failed NST and Bpp yesterday in  office. Was unable to come due to transportation problems. Reports good fetal  movement denies pian or discomfort.   LMP:  Onset of complaint: yesterday Pain score: 0  Vitals:   09/28/23 2138  BP: (!) 172/110  Pulse: 79  Resp: 18  Temp: 98.3 F (36.8 C)     FHT: 134  Lab orders placed from triage: pcr

## 2023-09-28 NOTE — MAU Provider Note (Signed)
 History     CSN: [id number]  Arrival date and time: 09/28/23 2056   Event Date/Time   First Provider Initiated Contact with Patient 09/28/23 2217      Chief Complaint  Patient presents with   Non-stress Test   Connie Mccarty is a 33 y.o. G2P1001 at [redacted]w[redacted]d who receives care at [facility].  She presents today for failed NST and NR BPP.  Patient states these tests occurred yesterday and she was suppose to have an appt today, for repeat testing, but missed it.  She states her office instructed her to come to the MAU today.   She reports taking Nifedipine  90 mg (1830) daily and labetalol  300 TID (0700/1600).   Patient denies HA, visual disturbances, SOB, or RUQ pain. She reports good fetal movement.  She denies abdominal cramping/contractions or vaginal concerns including bleeding, discharge, or leaking.   Patient reports she last ate at 430pm.  OB History     Gravida  2   Para  1   Term  1   Preterm      AB      Living  1      SAB      IAB      Ectopic      Multiple  0   Live Births  1           Past Medical History:  Diagnosis Date   Gestational diabetes    Hypertension    Pregnancy induced hypertension     Past Surgical History:  Procedure Laterality Date   CESAREAN SECTION N/A 07/24/2019   Procedure: CESAREAN SECTION;  Surgeon: Sudie Lavonia HERO, MD;  Location: [facility] LD ORS;  Service: Obstetrics;  Laterality: N/A;   CHOLECYSTECTOMY     NO PAST SURGERIES      Family History  Problem Relation Age of Onset   Diabetes Mother    Cervical cancer Mother    Diabetes Father    Heart disease Father     Social History   Tobacco Use   Smoking status: Never   Smokeless tobacco: Never  Vaping Use   Vaping status: Never Used  Substance Use Topics   Alcohol use: Never   Drug use: Never    Allergies: No Known Allergies  Medications Prior to Admission  Medication Sig Dispense Refill Last Dose/Taking   aspirin  81 MG chewable tablet Chew 81  mg by mouth daily.   09/28/2023 Evening   labetalol  (NORMODYNE ) 100 MG tablet Take 1 tablet (100 mg total) by mouth 2 (two) times daily. (Patient taking differently: Take 100 mg by mouth 3 (three) times daily.) 60 tablet 1 09/28/2023 at  4:00 PM   labetalol  (NORMODYNE ) 200 MG tablet Take 200 mg by mouth 3 (three) times daily.   09/28/2023 at  4:00 PM   NIFEdipine  (PROCARDIA  XL/NIFEDICAL-XL) 90 MG 24 hr tablet Take 90 mg by mouth daily.   09/28/2023 at  4:00 PM   Prenatal Vit-Fe Fumarate-FA (PRENATAL MULTIVITAMIN) TABS tablet Take 1 tablet by mouth daily at 12 noon.   09/28/2023   ondansetron  (ZOFRAN -ODT) 4 MG disintegrating tablet Take 4 mg by mouth every 8 (eight) hours as needed for nausea or vomiting.   More than a month    Review of Systems  Constitutional:  Negative for chills and fever.  Respiratory:  Negative for shortness of breath.   Gastrointestinal:  Negative for abdominal pain, constipation, diarrhea, nausea and vomiting.  Genitourinary:  Negative for difficulty urinating, dysuria, vaginal  bleeding and vaginal discharge.  Neurological:  Negative for dizziness, light-headedness and headaches.   Physical Exam   Blood pressure (!) 160/99, pulse 81, temperature 98.3 F (36.8 C), resp. rate 18, height 4' 11 (1.499 m), weight 86.6 kg, last menstrual period 12/20/2022, SpO2 100%, unknown if currently breastfeeding. Vitals:   09/28/23 2138 09/28/23 2155 09/28/23 2200 09/28/23 2202  BP: (!) 172/110 (!) 161/97  (!) 160/99  Pulse: 79 80  81  Resp: 18     Temp: 98.3 F (36.8 C)     SpO2:  98% 100%   Weight: 86.6 kg     Height: 4' 11 (1.499 m)      Physical Exam Vitals and nursing note reviewed.  Constitutional:      Appearance: Normal appearance.  HENT:     Head: Normocephalic and atraumatic.  Eyes:     Conjunctiva/sclera: Conjunctivae normal.  Cardiovascular:     Rate and Rhythm: Normal rate.  Pulmonary:     Effort: Pulmonary effort is normal. No respiratory distress.   Abdominal:     Palpations: Abdomen is soft.     Comments: Gravid, Appears AGA  Musculoskeletal:        General: Normal range of motion.     Cervical back: Normal range of motion.  Skin:    General: Skin is warm and dry.  Neurological:     Mental Status: She is alert and oriented to person, place, and time.  Psychiatric:        Mood and Affect: Mood normal.        Behavior: Behavior normal.     Fetal Assessment 135 bpm, Mod Var, -Decels, +15x15 Accels Toco: 2 ctx graphed  MAU Course   Results for orders placed or performed during the hospital encounter of 09/28/23 (from the past 24 hours)  Type and screen [facility]     Status: None   Collection Time: 09/28/23 10:11 PM  Result Value Ref Range   ABO/RH(D) O POS    Antibody Screen NEG    Sample Expiration      10/01/2023,2359 Performed at [facility], [address], [place], [place] [address]   CBC     Status: Abnormal   Collection Time: 09/28/23 10:12 PM  Result Value Ref Range   WBC 10.2 4.0 - 10.5 K/uL   RBC 4.59 3.87 - 5.11 MIL/uL   Hemoglobin 13.5 12.0 - 15.0 g/dL   HCT 58.7 63.9 - 53.9 %   MCV 89.8 80.0 - 100.0 fL   MCH 29.4 26.0 - 34.0 pg   MCHC 32.8 30.0 - 36.0 g/dL   RDW 84.2 (H) 88.4 - 84.4 %   Platelets 272 150 - 400 K/uL   nRBC 0.2 0.0 - 0.2 %  Comprehensive metabolic panel with GFR     Status: Abnormal   Collection Time: 09/28/23 10:12 PM  Result Value Ref Range   Sodium 136 135 - 145 mmol/L   Potassium 4.0 3.5 - 5.1 mmol/L   Chloride 109 98 - 111 mmol/L   CO2 17 (L) 22 - 32 mmol/L   Glucose, Bld 88 70 - 99 mg/dL   BUN 12 6 - 20 mg/dL   Creatinine, Ser 9.41 0.44 - 1.00 mg/dL   Calcium  9.3 8.9 - 10.3 mg/dL   Total Protein 6.7 6.5 - 8.1 g/dL   Albumin 2.7 (L) 3.5 - 5.0 g/dL   AST 21 15 - 41 U/L   ALT 13 0 - 44 U/L  Alkaline Phosphatase 178 (H) 38 - 126 U/L   Total Bilirubin 0.2 0.0 - 1.2 mg/dL   GFR, Estimated >39 >39 mL/min   Anion gap 10 5 - 15  Protein /  creatinine ratio, urine     Status: Abnormal   Collection Time: 09/28/23 10:12 PM  Result Value Ref Range   Creatinine, Urine 40 mg/dL   Total Protein, Urine 38 mg/dL   Protein Creatinine Ratio 0.95 (H) 0.00 - 0.15 mg/mg[Cre]   No results found.   MDM Physical Exam Start IV Labs: CBC, CMP, PC Ratio Measure BPQ15 min EFM Consult Assessment and Plan   33 year old G2P1001  SIUP at 36.4 weeks Cat I FT CHTN with SI PrE  -Orders placed including starting IV, labs, and antihypertensives. . -Provider to bedside and informed patient of recommendation for delivery considering severe blood pressures and known CHTN on medications. -Patient expresses desire to avoid delivery and informed that considering current vitals, it would be beneficial to patient and baby if delivery was to occur sooner than later.  -Reviewed risks associated with severe blood pressures including but not limited to patient seizure and fetal distress.  -Nurse instructed to give IV medication for management of blood pressures. Orders placed.  -Dr. DOROTHA Prude consulted and informed of patient status, evaluation, interventions, and recommendation for delivery. Advised: *Contact primary ob with recommendation.    Harlene LITTIE Duncans MSN, CNM 09/28/2023, 10:17 PM   Reassessment (10:56 PM) -Dr. Linnell informed of patient arrival and initial vitals with provider recommendation and attending support for delivery. Agrees with plan and is en route to speak with patient.  -Charge nurse updated on POC. -Care relinquished to [facility]-Dr. S. Almquist.  Harlene LITTIE Duncans MSN, CNM Advanced Practice Provider, [facility]

## 2023-09-28 NOTE — H&P (Addendum)
 Connie Mccarty is a 33 y.o. G2P1001 at [redacted]w[redacted]d gestation sent by office d/t h/o CHTN with superimposed mild pre-eclampsia with worsening labs. She is taking procardia  90,mg xl daily and labetalol  300mg  tid (increase yesterday from 200mg  tid). She presents today with severe range bps requiring iv labetalol . She says she feels fine - denies h/a, vision changes, ruq pain. +FM, no vb/lof/ctx. The patient was to have returned to office today for repeat bpp/nst but did not show up - yesterday 6//10 testing.  Antepartum course: gdma1 - controlled well with diet; CHTN with superimposed mild pre-e, meds as above, obesity; efw: 7lb yesterday  [facility] 10 wks.  See complete pre-natal records  History OB History     Gravida  2   Para  1   Term  1   Preterm      AB      Living  1      SAB      IAB      Ectopic      Multiple  0   Live Births  1          Past Medical History:  Diagnosis Date   Gestational diabetes    Hypertension    Pregnancy induced hypertension    Past Surgical History:  Procedure Laterality Date   CESAREAN SECTION N/A 07/24/2019   Procedure: CESAREAN SECTION;  Surgeon: Sudie Lavonia HERO, MD;  Location: [facility] LD ORS;  Service: Obstetrics;  Laterality: N/A;   CHOLECYSTECTOMY     NO PAST SURGERIES     Family History: family history includes Cervical cancer in her mother; Diabetes in her father and mother; Heart disease in her father. Social History:  reports that she has never smoked. She has never used smokeless tobacco. She reports that she does not drink alcohol and does not use drugs.  ROS: See above otherwise negative  Prenatal labs:  ABO, Rh:   Antibody: Negative (01/13 0000) Rubella: Immune (02/14 0000) RPR: Nonreactive (02/14 0000)  HBsAg: Negative (02/14 0000)  HIV:Non-reactive (02/14 0000)  GBS:   negative 1 hr Glucola: failed - gdma1 Genetic screening: Normal Anatomy US : Normal  Physical Exam:     Blood  pressure (!) 141/86, pulse 78, temperature 98.3 F (36.8 C), resp. rate 18, height 4' 11 (1.499 m), weight 86.6 kg, last menstrual period 12/20/2022, SpO2 99%, unknown if currently breastfeeding. A&O x 3 HEENT: Normal Lungs: CTAB CV: RRR Abdominal: Soft, Non-tender, and Gravid Lower Extremities: Non-edematous, Non-tender  Pelvic Exam:     Deferred  Labs:  CBC:  Lab Results  Component Value Date   WBC 10.2 09/28/2023   RBC 4.59 09/28/2023   HGB 13.5 09/28/2023   HCT 41.2 09/28/2023   MCV 89.8 09/28/2023   MCH 29.4 09/28/2023   MCHC 32.8 09/28/2023   RDW 15.7 (H) 09/28/2023   PLT 272 09/28/2023   CMP:  Lab Results  Component Value Date   NA 136 09/28/2023   K 4.0 09/28/2023   CL 109 09/28/2023   CO2 17 (L) 09/28/2023   GLUCOSE 88 09/28/2023   BUN 12 09/28/2023   CREATININE 0.58 09/28/2023   CALCIUM  9.3 09/28/2023   PROT 6.7 09/28/2023   AST 21 09/28/2023   ALT 13 09/28/2023   ALBUMIN 2.7 (L) 09/28/2023   ALKPHOS 178 (H) 09/28/2023   BILITOT 0.2 09/28/2023   GFRNONAA >60 09/28/2023   GFRAA >60 07/23/2019   ANIONGAP 10 09/28/2023   Results for orders placed or  performed during the hospital encounter of 09/28/23 (from the past 24 hours)  Type and screen [facility]     Status: None (Preliminary result)   Collection Time: 09/28/23 10:11 PM  Result Value Ref Range   ABO/RH(D) PENDING    Antibody Screen PENDING    Sample Expiration      10/01/2023,2359 Performed at [facility], [address], [place], [place] [address]   CBC     Status: Abnormal   Collection Time: 09/28/23 10:12 PM  Result Value Ref Range   WBC 10.2 4.0 - 10.5 K/uL   RBC 4.59 3.87 - 5.11 MIL/uL   Hemoglobin 13.5 12.0 - 15.0 g/dL   HCT 58.7 63.9 - 53.9 %   MCV 89.8 80.0 - 100.0 fL   MCH 29.4 26.0 - 34.0 pg   MCHC 32.8 30.0 - 36.0 g/dL   RDW 84.2 (H) 88.4 - 84.4 %   Platelets 272 150 - 400 K/uL   nRBC 0.2 0.0 - 0.2 %  Comprehensive metabolic panel with GFR      Status: Abnormal   Collection Time: 09/28/23 10:12 PM  Result Value Ref Range   Sodium 136 135 - 145 mmol/L   Potassium 4.0 3.5 - 5.1 mmol/L   Chloride 109 98 - 111 mmol/L   CO2 17 (L) 22 - 32 mmol/L   Glucose, Bld 88 70 - 99 mg/dL   BUN 12 6 - 20 mg/dL   Creatinine, Ser 9.41 0.44 - 1.00 mg/dL   Calcium  9.3 8.9 - 10.3 mg/dL   Total Protein 6.7 6.5 - 8.1 g/dL   Albumin 2.7 (L) 3.5 - 5.0 g/dL   AST 21 15 - 41 U/L   ALT 13 0 - 44 U/L   Alkaline Phosphatase 178 (H) 38 - 126 U/L   Total Bilirubin 0.2 0.0 - 1.2 mg/dL   GFR, Estimated >39 >39 mL/min   Anion gap 10 5 - 15   Pc ratio 0.95  Prenatal Transfer Tool  Maternal Diabetes: Yes:  Diabetes Type:  Diet controlled Genetic Screening: Normal Maternal Ultrasounds/Referrals: Normal Fetal Ultrasounds or other Referrals:  None Maternal Substance Abuse:  No Significant Maternal Medications:  Meds include: Other: procardia , labetalol  Significant Maternal Lab Results: Group B Strep negative Number of Prenatal Visits:greater than 3 verified prenatal visits Maternal Vaccinations:TDap Other Comments:  None    Assessment/Plan:  33 y.o. G2P1001 at [redacted]w[redacted]d gestation   CHTN with superimposed pre-e: pt is asymptomatic though requiring iv labetalol ; pc ratio in office yesterday resulted this afternoon as 1.07. I have discussed the worsening disease and now needs delivery now. She has a h/o c/s and plan for rltcs. I have reviewed risks including bleeding, infection, risk of injury to bowel, bladder, nerves, blood vesselt, risk of further surgery, risk of anesthesia. Consent signed, proceed to OR. Plan magnesium  sulfate x24 hours after delivery for seizure prophylaxis and will follow bps closely. Fetal status overall reassuring   Devere FORBES Brave 09/28/2023, 11:27 PM

## 2023-09-28 NOTE — Anesthesia Preprocedure Evaluation (Signed)
 Anesthesia Evaluation  Patient identified by MRN, date of birth, ID band Patient awake  General Assessment Comment:  2nd c/s for pre eclampsia with severe features. Recent labs reviewed.  Reviewed: Allergy & Precautions, NPO status , Patient's Chart, lab work & pertinent test results  History of Anesthesia Complications Negative for: history of anesthetic complications  Airway Mallampati: III  TM Distance: >3 FB Neck ROM: Full    Dental no notable dental hx. (+) Teeth Intact   Pulmonary neg pulmonary ROS, neg sleep apnea, neg COPD, Patient abstained from smoking.Not current smoker   Pulmonary exam normal breath sounds clear to auscultation       Cardiovascular Exercise Tolerance: Good METShypertension, Pt. on medications (-) CAD and (-) Past MI (-) dysrhythmias  Rhythm:Regular Rate:Normal - Systolic murmurs    Neuro/Psych negative neurological ROS  negative psych ROS   GI/Hepatic ,neg GERD  ,,(+)     (-) substance abuse    Endo/Other  neg diabetes    Renal/GU negative Renal ROS     Musculoskeletal   Abdominal  (+) + obese  Peds  Hematology Denies blood thinner use or bleeding disorders.    Anesthesia Other Findings Past Medical History: No date: Gestational diabetes No date: Hypertension No date: Pregnancy induced hypertension  Reproductive/Obstetrics (+) Pregnancy                              Anesthesia Physical Anesthesia Plan  ASA: 3  Anesthesia Plan: Spinal   Post-op Pain Management: Ofirmev  IV (intra-op)* and Toradol  IV (intra-op)*   Induction:   PONV Risk Score and Plan: 4 or greater and Ondansetron  and Dexamethasone   Airway Management Planned: Natural Airway  Additional Equipment:   Intra-op Plan:   Post-operative Plan:   Informed Consent: I have reviewed the patients History and Physical, chart, labs and discussed the procedure including the risks, benefits  and alternatives for the proposed anesthesia with the patient or authorized representative who has indicated his/her understanding and acceptance.       Plan Discussed with: CRNA and Surgeon  Anesthesia Plan Comments: (Discussed R/B/A of neuraxial anesthesia technique with patient: - rare risks of spinal/epidural hematoma, nerve damage, infection - Risk of PDPH - Risk of itching - Risk of nausea and vomiting - Risk of conversion to general anesthesia and its associated risks, including sore throat, damage to lips/teeth/oropharynx, and rare risks such as cardiac and respiratory events. - Risk of surgical bleeding requiring blood products - Risk of allergic reactions Discussed the role of CRNA in patient's perioperative care.  Patient voiced understanding.)        Anesthesia Quick Evaluation

## 2023-09-29 ENCOUNTER — Inpatient Hospital Stay (HOSPITAL_COMMUNITY): Payer: Self-pay | Admitting: Anesthesiology

## 2023-09-29 ENCOUNTER — Other Ambulatory Visit: Payer: Self-pay

## 2023-09-29 ENCOUNTER — Encounter (HOSPITAL_COMMUNITY): Payer: Self-pay | Admitting: Obstetrics and Gynecology

## 2023-09-29 DIAGNOSIS — O1413 Severe pre-eclampsia, third trimester: Secondary | ICD-10-CM

## 2023-09-29 DIAGNOSIS — Z98891 History of uterine scar from previous surgery: Secondary | ICD-10-CM

## 2023-09-29 DIAGNOSIS — Z3A36 36 weeks gestation of pregnancy: Secondary | ICD-10-CM

## 2023-09-29 DIAGNOSIS — O1410 Severe pre-eclampsia, unspecified trimester: Secondary | ICD-10-CM | POA: Diagnosis present

## 2023-09-29 LAB — CBC
HCT: 36.9 % (ref 36.0–46.0)
HCT: 35.3 % — ABNORMAL LOW (ref 36.0–46.0)
Hemoglobin: 12 g/dL (ref 12.0–15.0)
Hemoglobin: 11.7 g/dL — ABNORMAL LOW (ref 12.0–15.0)
MCH: 29.6 pg (ref 26.0–34.0)
MCH: 30 pg (ref 26.0–34.0)
MCHC: 32.5 g/dL (ref 30.0–36.0)
MCHC: 33.1 g/dL (ref 30.0–36.0)
MCV: 90.9 fL (ref 80.0–100.0)
MCV: 90.5 fL (ref 80.0–100.0)
Platelets: 244 K/uL (ref 150–400)
Platelets: 248 K/uL (ref 150–400)
RBC: 4.06 MIL/uL (ref 3.87–5.11)
RBC: 3.9 MIL/uL (ref 3.87–5.11)
RDW: 15.6 % — ABNORMAL HIGH (ref 11.5–15.5)
RDW: 15.7 % — ABNORMAL HIGH (ref 11.5–15.5)
WBC: 12.5 K/uL — ABNORMAL HIGH (ref 4.0–10.5)
WBC: 18.4 K/uL — ABNORMAL HIGH (ref 4.0–10.5)
nRBC: 0.2 % (ref 0.0–0.2)
nRBC: 0.1 % (ref 0.0–0.2)

## 2023-09-29 LAB — DIC (DISSEMINATED INTRAVASCULAR COAGULATION)PANEL
D-Dimer, Quant: 3.91 ug{FEU}/mL — ABNORMAL HIGH (ref 0.00–0.50)
Fibrinogen: 562 mg/dL — ABNORMAL HIGH (ref 210–475)
INR: 1 (ref 0.8–1.2)
Platelets: 248 K/uL (ref 150–400)
Prothrombin Time: 13.3 s (ref 11.4–15.2)
Smear Review: NONE SEEN
aPTT: 27 s (ref 24–36)

## 2023-09-29 LAB — COMPREHENSIVE METABOLIC PANEL WITH GFR
ALT: 15 U/L (ref 0–44)
AST: 21 U/L (ref 15–41)
Albumin: 2.3 g/dL — ABNORMAL LOW (ref 3.5–5.0)
Alkaline Phosphatase: 165 U/L — ABNORMAL HIGH (ref 38–126)
Anion gap: 8 (ref 5–15)
BUN: 9 mg/dL (ref 6–20)
CO2: 18 mmol/L — ABNORMAL LOW (ref 22–32)
Calcium: 8.4 mg/dL — ABNORMAL LOW (ref 8.9–10.3)
Chloride: 107 mmol/L (ref 98–111)
Creatinine, Ser: 0.7 mg/dL (ref 0.44–1.00)
GFR, Estimated: >60 mL/min (ref >60)
Glucose, Bld: 100 mg/dL — ABNORMAL HIGH (ref 70–99)
Potassium: 4.5 mmol/L (ref 3.5–5.1)
Sodium: 133 mmol/L — ABNORMAL LOW (ref 135–145)
Total Bilirubin: 0.4 mg/dL (ref 0.0–1.2)
Total Protein: 5.9 g/dL — ABNORMAL LOW (ref 6.5–8.1)

## 2023-09-29 LAB — GLUCOSE, CAPILLARY
Glucose-Capillary: 107 mg/dL — ABNORMAL HIGH (ref 70–99)
Glucose-Capillary: 100 mg/dL — ABNORMAL HIGH (ref 70–99)
Glucose-Capillary: 83 mg/dL (ref 70–99)
Glucose-Capillary: 108 mg/dL — ABNORMAL HIGH (ref 70–99)

## 2023-09-29 LAB — PROTEIN / CREATININE RATIO, URINE
Creatinine, Urine: 40 mg/dL
Protein Creatinine Ratio: 0.95 mg/mg{creat} — ABNORMAL HIGH (ref 0.00–0.15)
Total Protein, Urine: 38 mg/dL

## 2023-09-29 LAB — TYPE AND SCREEN
ABO/RH(D): O POS
Antibody Screen: NEGATIVE

## 2023-09-29 LAB — POSTPARTUM HEMORRHAGE (BB NOTIFICATION)

## 2023-09-29 LAB — RPR: RPR Ser Ql: NONREACTIVE

## 2023-09-29 MED ORDER — TRANEXAMIC ACID-NACL 1000-0.7 MG/100ML-% IV SOLN
1000.0000 mg | INTRAVENOUS | Status: AC
  Administered 2023-09-29: 1000 mg via INTRAVENOUS

## 2023-09-29 MED ORDER — WITCH HAZEL-GLYCERIN EX PADS: 1.0000 | MEDICATED_PAD | CUTANEOUS | Status: DC | PRN

## 2023-09-29 MED ORDER — DIPHENHYDRAMINE HCL 50 MG/ML IJ SOLN
12.5000 mg | INTRAMUSCULAR | Status: DC | PRN
  Administered 2023-09-29: 12.5 mg via INTRAVENOUS
  Filled 2023-09-29: qty 1

## 2023-09-29 MED ORDER — OXYTOCIN-SODIUM CHLORIDE 30-0.9 UT/500ML-% IV SOLN
INTRAVENOUS | Status: DC | PRN
  Administered 2023-09-29: 30 [IU] via INTRAVENOUS

## 2023-09-29 MED ORDER — OXYCODONE HCL 5 MG PO TABS: 5.0000 mg | ORAL_TABLET | ORAL | Status: DC | PRN

## 2023-09-29 MED ORDER — OXYTOCIN-SODIUM CHLORIDE 30-0.9 UT/500ML-% IV SOLN: 2.5000 [IU]/h | INTRAVENOUS | Status: AC

## 2023-09-29 MED ORDER — LABETALOL HCL 5 MG/ML IV SOLN: 80.0000 mg | INTRAVENOUS | Status: DC | PRN

## 2023-09-29 MED ORDER — CARBOPROST TROMETHAMINE 250 MCG/ML IM SOLN
INTRAMUSCULAR | Status: AC
  Filled 2023-09-29: qty 1

## 2023-09-29 MED ORDER — NALOXONE HCL 0.4 MG/ML IJ SOLN: 0.4000 mg | INTRAMUSCULAR | Status: DC | PRN

## 2023-09-29 MED ORDER — ACETAMINOPHEN 500 MG PO TABS: 1000.0000 mg | ORAL_TABLET | Freq: Four times a day (QID) | ORAL | Status: DC

## 2023-09-29 MED ORDER — HYDRALAZINE HCL 20 MG/ML IJ SOLN: 10.0000 mg | INTRAMUSCULAR | Status: DC | PRN

## 2023-09-29 MED ORDER — PHENYLEPHRINE HCL (PRESSORS) 10 MG/ML IV SOLN
INTRAVENOUS | Status: DC | PRN
  Administered 2023-09-29: 160 ug via INTRAVENOUS

## 2023-09-29 MED ORDER — LACTATED RINGERS IV SOLN: INTRAVENOUS | Status: AC

## 2023-09-29 MED ORDER — LABETALOL HCL 5 MG/ML IV SOLN: 40.0000 mg | INTRAVENOUS | Status: DC | PRN

## 2023-09-29 MED ORDER — ACETAMINOPHEN 500 MG PO TABS
1000.0000 mg | ORAL_TABLET | Freq: Four times a day (QID) | ORAL | Status: DC
  Administered 2023-09-29 – 2023-10-02 (×13): 1000 mg via ORAL
  Filled 2023-09-29 (×14): qty 2

## 2023-09-29 MED ORDER — ONDANSETRON HCL 4 MG/2ML IJ SOLN
INTRAMUSCULAR | Status: DC | PRN
  Administered 2023-09-29: 4 mg via INTRAVENOUS

## 2023-09-29 MED ORDER — DIPHENHYDRAMINE HCL 50 MG/ML IJ SOLN
INTRAMUSCULAR | Status: DC | PRN
Start: 2023-09-29 — End: 2023-09-29
  Administered 2023-09-29: 12.5 mg via INTRAVENOUS

## 2023-09-29 MED ORDER — MORPHINE SULFATE (PF) 0.5 MG/ML IJ SOLN
INTRAMUSCULAR | Status: DC | PRN
  Administered 2023-09-29: 150 ug via INTRATHECAL

## 2023-09-29 MED ORDER — SODIUM CHLORIDE 0.9 % IV SOLN
3.0000 g | Freq: Four times a day (QID) | INTRAVENOUS | Status: AC
  Administered 2023-09-29 – 2023-09-30 (×3): 3 g via INTRAVENOUS
  Filled 2023-09-29 (×3): qty 8

## 2023-09-29 MED ORDER — STERILE WATER FOR IRRIGATION IR SOLN
Status: DC | PRN
  Administered 2023-09-29: 1

## 2023-09-29 MED ORDER — DIPHENOXYLATE-ATROPINE 2.5-0.025 MG PO TABS: 1.0000 | ORAL_TABLET | ORAL | Status: AC

## 2023-09-29 MED ORDER — SODIUM CHLORIDE 0.9 % IR SOLN
Status: DC | PRN
  Administered 2023-09-29: 1

## 2023-09-29 MED ORDER — SENNOSIDES-DOCUSATE SODIUM 8.6-50 MG PO TABS
2.0000 | ORAL_TABLET | Freq: Every day | ORAL | Status: DC
  Administered 2023-09-30 – 2023-10-02 (×3): 2 via ORAL
  Filled 2023-09-29 (×3): qty 2

## 2023-09-29 MED ORDER — NIFEDIPINE ER OSMOTIC RELEASE 60 MG PO TB24
90.0000 mg | ORAL_TABLET | Freq: Every day | ORAL | Status: DC
  Administered 2023-09-29 – 2023-10-02 (×4): 90 mg via ORAL
  Filled 2023-09-29 (×4): qty 1

## 2023-09-29 MED ORDER — CARBOPROST TROMETHAMINE 250 MCG/ML IM SOLN: 250.0000 ug | Freq: Once | INTRAMUSCULAR | Status: DC

## 2023-09-29 MED ORDER — COCONUT OIL OIL
1.0000 | TOPICAL_OIL | Status: DC | PRN
  Administered 2023-09-29: 1 via TOPICAL

## 2023-09-29 MED ORDER — MAGNESIUM SULFATE 40 GM/1000ML IV SOLN
INTRAVENOUS | Status: AC
  Filled 2023-09-29: qty 1000

## 2023-09-29 MED ORDER — DIBUCAINE (PERIANAL) 1 % EX OINT: 1.0000 | TOPICAL_OINTMENT | CUTANEOUS | Status: DC | PRN

## 2023-09-29 MED ORDER — FENTANYL CITRATE (PF) 100 MCG/2ML IJ SOLN: 25.0000 ug | INTRAMUSCULAR | Status: DC | PRN

## 2023-09-29 MED ORDER — SIMETHICONE 80 MG PO CHEW
80.0000 mg | CHEWABLE_TABLET | Freq: Three times a day (TID) | ORAL | Status: DC
  Administered 2023-09-29 – 2023-10-02 (×11): 80 mg via ORAL
  Filled 2023-09-29 (×11): qty 1

## 2023-09-29 MED ORDER — MISOPROSTOL 200 MCG PO TABS
1000.0000 ug | ORAL_TABLET | Freq: Once | ORAL | Status: AC
  Administered 2023-09-29: 1000 ug via RECTAL

## 2023-09-29 MED ORDER — BUPIVACAINE IN DEXTROSE 0.75-8.25 % IT SOLN
INTRATHECAL | Status: DC | PRN
  Administered 2023-09-29: 1.4 mL via INTRATHECAL

## 2023-09-29 MED ORDER — MENTHOL 3 MG MT LOZG: 1.0000 | LOZENGE | OROMUCOSAL | Status: DC | PRN

## 2023-09-29 MED ORDER — FENTANYL CITRATE (PF) 100 MCG/2ML IJ SOLN: INTRAMUSCULAR | Status: DC | PRN

## 2023-09-29 MED ORDER — PRENATAL MULTIVITAMIN CH
1.0000 | ORAL_TABLET | Freq: Every day | ORAL | Status: DC
  Administered 2023-09-29 – 2023-10-02 (×4): 1 via ORAL
  Filled 2023-09-29 (×4): qty 1

## 2023-09-29 MED ORDER — MAGNESIUM SULFATE BOLUS VIA INFUSION
4.0000 g | Freq: Once | INTRAVENOUS | Status: AC
  Administered 2023-09-29: 4 g via INTRAVENOUS
  Filled 2023-09-29: qty 1000

## 2023-09-29 MED ORDER — SODIUM CHLORIDE 0.9% FLUSH: 3.0000 mL | INTRAVENOUS | Status: DC | PRN

## 2023-09-29 MED ORDER — IBUPROFEN 600 MG PO TABS
600.0000 mg | ORAL_TABLET | Freq: Four times a day (QID) | ORAL | Status: DC
  Administered 2023-09-30 – 2023-10-02 (×10): 600 mg via ORAL
  Filled 2023-09-29 (×10): qty 1

## 2023-09-29 MED ORDER — OXYTOCIN-SODIUM CHLORIDE 30-0.9 UT/500ML-% IV SOLN
INTRAVENOUS | Status: AC
Start: 2023-09-29 — End: 2023-09-29
  Filled 2023-09-29: qty 500

## 2023-09-29 MED ORDER — SODIUM CHLORIDE 0.9 % IV SOLN: INTRAVENOUS | Status: DC | PRN

## 2023-09-29 MED ORDER — FENTANYL CITRATE (PF) 100 MCG/2ML IJ SOLN
INTRAMUSCULAR | Status: DC | PRN
  Administered 2023-09-29: 15 ug via INTRATHECAL

## 2023-09-29 MED ORDER — DEXAMETHASONE SODIUM PHOSPHATE 4 MG/ML IJ SOLN
INTRAMUSCULAR | Status: DC | PRN
  Administered 2023-09-29: 4 mg via INTRAVENOUS

## 2023-09-29 MED ORDER — LABETALOL HCL 5 MG/ML IV SOLN
20.0000 mg | INTRAVENOUS | Status: DC | PRN
  Administered 2023-09-29: 20 mg via INTRAVENOUS

## 2023-09-29 MED ORDER — KETOROLAC TROMETHAMINE 30 MG/ML IJ SOLN: 30.0000 mg | Freq: Four times a day (QID) | INTRAMUSCULAR | Status: DC

## 2023-09-29 MED ORDER — KETOROLAC TROMETHAMINE 30 MG/ML IJ SOLN
30.0000 mg | Freq: Four times a day (QID) | INTRAMUSCULAR | Status: AC
  Administered 2023-09-29 – 2023-09-30 (×4): 30 mg via INTRAVENOUS
  Filled 2023-09-29 (×4): qty 1

## 2023-09-29 MED ORDER — TRANEXAMIC ACID-NACL 1000-0.7 MG/100ML-% IV SOLN
INTRAVENOUS | Status: AC
  Filled 2023-09-29: qty 100

## 2023-09-29 MED ORDER — LACTATED RINGERS IV BOLUS: 1000.0000 mL | Freq: Once | INTRAVENOUS | Status: DC

## 2023-09-29 MED ORDER — DIPHENHYDRAMINE HCL 25 MG PO CAPS: 25.0000 mg | ORAL_CAPSULE | Freq: Four times a day (QID) | ORAL | Status: DC | PRN

## 2023-09-29 MED ORDER — NALOXONE HCL 4 MG/10ML IJ SOLN: 1.0000 ug/kg/h | INTRAVENOUS | Status: DC | PRN

## 2023-09-29 MED ORDER — ONDANSETRON HCL 4 MG/2ML IJ SOLN: 4.0000 mg | Freq: Three times a day (TID) | INTRAMUSCULAR | Status: DC | PRN

## 2023-09-29 MED ORDER — SIMETHICONE 80 MG PO CHEW: 80.0000 mg | CHEWABLE_TABLET | ORAL | Status: DC | PRN

## 2023-09-29 MED ORDER — DIPHENOXYLATE-ATROPINE 2.5-0.025 MG PO TABS
ORAL_TABLET | ORAL | Status: AC
  Filled 2023-09-29: qty 1

## 2023-09-29 MED ORDER — DIPHENHYDRAMINE HCL 25 MG PO CAPS: 25.0000 mg | ORAL_CAPSULE | ORAL | Status: DC | PRN

## 2023-09-29 MED ORDER — MAGNESIUM SULFATE 40 GM/1000ML IV SOLN
2.0000 g/h | INTRAVENOUS | Status: AC
  Administered 2023-09-29: 2 g/h via INTRAVENOUS

## 2023-09-29 MED ORDER — PHENYLEPHRINE HCL-NACL 20-0.9 MG/250ML-% IV SOLN
INTRAVENOUS | Status: DC | PRN
Start: 2023-09-29 — End: 2023-09-29
  Administered 2023-09-29: 60 ug/min via INTRAVENOUS

## 2023-09-29 MED ORDER — SODIUM CHLORIDE 0.9 % IV SOLN
3.0000 g | Freq: Four times a day (QID) | INTRAVENOUS | Status: DC
  Administered 2023-09-29: 3 g via INTRAVENOUS
  Filled 2023-09-29 (×2): qty 8

## 2023-09-29 MED ORDER — LABETALOL HCL 5 MG/ML IV SOLN
INTRAVENOUS | Status: AC
  Filled 2023-09-29: qty 4

## 2023-09-29 NOTE — Transfer of Care (Signed)
 Immediate Anesthesia Transfer of Care Note  Patient: Connie Mccarty  Procedure(s) Performed: CESAREAN DELIVERY  Patient Location: PACU  Anesthesia Type:Spinal  Level of Consciousness: awake, alert , and oriented  Airway & Oxygen Therapy: Patient Spontanous Breathing  Post-op Assessment: Report given to RN and Post -op Vital signs reviewed and stable  Post vital signs: Reviewed and stable  Last Vitals:  Vitals Value Taken Time  BP 130/85 09/29/23 02:15  Temp    Pulse 83 09/29/23 02:20  Resp 18 09/29/23 02:20  SpO2 100 % 09/29/23 02:20  Vitals shown include unfiled device data.  Last Pain: There were no vitals filed for this visit.       Complications: No notable events documented.

## 2023-09-29 NOTE — Progress Notes (Signed)
 Called to PACU by nursing, PPH   Pt comfortable in bed  Patient Vitals for the past 24 hrs:  BP Temp Pulse Resp SpO2 Height Weight  09/29/23 0252 (!) 177/104 -- 98 17 100 % -- --  09/29/23 0250 (!) 174/104 -- 98 18 99 % -- --  09/29/23 0246 (!) 145/101 -- 100 20 98 % -- --  09/29/23 0243 (!) 146/124 -- 97 -- 100 % -- --  09/29/23 0231 (!) 153/110 -- 85 16 100 % -- --  09/29/23 0228 (!) 127/100 -- 91 16 98 % -- --  09/29/23 0221 116/89 -- 83 18 100 % -- --  09/29/23 0215 130/85 -- 77 16 97 % -- --  09/28/23 2250 -- -- -- -- 99 % -- --  09/28/23 2240 -- -- -- -- 99 % -- --  09/28/23 2230 (!) 141/86 -- 78 -- 99 % -- --  09/28/23 2220 -- -- -- -- 99 % -- --  09/28/23 2210 -- -- -- -- 100 % -- --  09/28/23 2202 (!) 160/99 -- 81 -- -- -- --  09/28/23 2200 -- -- -- -- 100 % -- --  09/28/23 2155 (!) 161/97 -- 80 -- 98 % -- --  09/28/23 2138 (!) 172/110 98.3 F (36.8 C) 79 18 -- 4' 11 (1.499 m) 86.6 kg     A&ox3 Abd: soft,nt, provena in place; fundus above Umb about 1-2 cm; bme performed and very distended LUS - large amount of clot removed with BME, txa and cytoec given; continued fundal massage with clearing of clots; cervix became soft and no longer distended; also given during this time; now with fundal rub there is no further bleeding; u/s performed during massage to help assess uterine cavity and appeared clear; continued good tone and Jada placed w/o difficulty to help evacuate uterus and prevent futher collection of clot; pt tolerated all of this well; small amt  cleared from uterus Total surgical/PPH ebl Cbc, DIC panel pending PPH code was called during this time also  Plan to leave Jada in place for about 4 hrs, will then reassess Magnesium  sulfate started once stable as bps severe range (initially held during hemorrhage) Labetalol  protocol now and begin procardia  90mg  xl daily

## 2023-09-29 NOTE — Lactation Note (Signed)
 This note was copied from a baby's chart. Lactation Consultation Note  Patient Name: Connie Mccarty Date: 09/29/2023 Age:34 hours Reason for consult: Initial assessment;Other (Comment);Late-preterm 34-36.6wks (gHTN, Pre-E)  Visited with family of 97 51/30 weeks old AGA female Connie Mccarty; Connie Mccarty is a P2 and experienced breastfeeding. She has already taking baby to breast but she's falling asleep during the feedings. Discussed how LPI most likely will need supplementation until their feeding skills are more mature. Offered donor milk or formula as a bridge until hers come in. Set up a DEBP but unable to initiate pumping during [facility] consult as Connie Mccarty was having lunch. She voiced she'll start pumping after she's done and that she'll also wake baby up to feed because it's been already 3 hours. Reviewed LPI behavior, feeding cues, size of baby's stomach, pumping schedule, CDC and anticipatory guidelines.   Maternal Data Has patient been taught Hand Expression?: Yes Does the patient have breastfeeding experience prior to this delivery?: Yes How long did the patient breastfeed?: 14 months  Feeding Mother's Current Feeding Choice: Breast Milk  Lactation Tools Discussed/Used Tools: Pump;Flanges;Coconut oil Flange Size: 21 Breast pump type: Double-Electric Breast Pump Pump Education: Setup, frequency, and cleaning;Milk Storage Reason for Pumping: LPI Pumping frequency: DEBP was set up at 11 hours post-partum but pumping hasn't been initiated yet  Interventions Interventions: Breast feeding basics reviewed;DEBP;Coconut oil;Education;LC Services brochure;CDC milk storage guidelines;CDC Guidelines for Breast Pump Cleaning;NICU Pumping Log;LPT handout/interventions  Plan STS whenever possible Massage and hand express both breasts prior feedings/pumping Pump both breasts every 3 hours for 15 minutes, ideally 8 pumping sessions/24 hours Put baby to breast 8 times/24 hours or sooner  if feeding cues are present Supplement with EBM/donor milk or formula every 3 hours after feedings/attempts at the breast. She's aware that feeding time should not exceed 30 minutes total (15 breast/15 bottle)  No other support person at this time. All questions and concerns answered, family to contact [facility] services PRN.  Discharge Pump: Hands Free (Mom [employer]zy wearable pump through insurance)  Consult Status Consult Status: Follow-up Date: 09/30/23 Follow-up type: In-patient   Connie Mccarty S Connie Mccarty 09/29/2023, 1:15 PM

## 2023-09-29 NOTE — Anesthesia Postprocedure Evaluation (Signed)
 Anesthesia Post Note  Patient: Connie Mccarty  Procedure(s) Performed: CESAREAN DELIVERY     Patient location during evaluation: L&D Anesthesia Type: Spinal Level of consciousness: oriented and awake and alert Pain management: pain level controlled Vital Signs Assessment: post-procedure vital signs reviewed and stable Respiratory status: spontaneous breathing, respiratory function stable and patient connected to nasal cannula oxygen Cardiovascular status: blood pressure returned to baseline and stable Postop Assessment: no headache, no backache, no apparent nausea or vomiting and spinal receding Anesthetic complications: no   No notable events documented.  Last Vitals:  Vitals:   09/29/23 0345 09/29/23 0400  BP: (!) 146/84 (!) 148/95  Pulse: 81 83  Resp: 14 19  Temp:    SpO2: 96% 94%    Last Pain:  Vitals:   09/29/23 0609  TempSrc:   PainSc: 2    Pain Goal:                   Rome Ade

## 2023-09-29 NOTE — Anesthesia Procedure Notes (Signed)
 Spinal  Patient location during procedure: OR Start time: 09/29/2023 12:15 AM End time: 09/29/2023 12:22 AM Reason for block: surgical anesthesia Staffing Performed: anesthesiologist  Anesthesiologist: Boone Fess, MD Performed by: Boone Fess, MD Authorized by: Boone Fess, MD   Preanesthetic Checklist Completed: patient identified, IV checked, site marked, risks and benefits discussed, surgical consent, monitors and equipment checked, pre-op evaluation and timeout performed Spinal Block Patient position: sitting Prep: ChloraPrep and site prepped and draped Patient monitoring: heart rate, continuous pulse ox, blood pressure and cardiac monitor Approach: midline Location: L4-5 Injection technique: single-shot Needle Needle type: Whitacre and Introducer  Needle gauge: 24 G Needle length: 9 cm Assessment Sensory level: T10 Events: CSF return Additional Notes Meticulous sterile technique used throughout (CHG prep, sterile gloves, sterile drape). Negative paresthesia. Negative blood return. Positive free-flowing CSF. Expiration date of kit checked and confirmed. Patient tolerated procedure well, without complications.

## 2023-09-29 NOTE — Op Note (Signed)
 Cesarean Section Procedure Note   Connie Mccarty  09/28/2023 - 09/29/2023  Indications: CHTN with superimposed severe pre-eclampsia, h/o prior c/s, desires repeat   Pre-operative Diagnosis: pre-eclampsia with severe features; desires repeat cesarean.  Procedure: rltcs Post-operative Diagnosis: Same; adhesive dz  Surgeon: Surgeons and Role:    * Jayron Maqueda, Devere BRAVO, MD - Primary   Assistants: Sherrilee Both  Anesthesia: spinal   Procedure Details:  The patient was seen in MAU. The risks, benefits, complications, treatment options, and expected outcomes were discussed with the patient. The patient concurred with the proposed plan, giving informed consent. identified as Connie Mccarty Grade and the procedure verified as C-Section Delivery. A Time Out was held and the above information confirmed.  After induction of anesthesia, the patient was draped and prepped in the usual sterile manner, foley was draining urine well.  A pfannenstiel incision was made and carried down through the subcutaneous tissue to the fascia. Fascial incision was made and extended transversely. The fascia was separated from the underlying rectus tissue superiorly and inferiorly. The peritoneum was identified and entered when dividing the rectus muscles d/t scarring. Omentum noted to be adhesed to peritoneum and rectus.  Peritoneal incision was extended longitudinally. Alexis-O retractor placed. The utero-vesical peritoneal reflection was incised transversely and the bladder flap was bluntly freed from the lower uterine segment. A low transverse uterine incision was made. Delivered from cephalic presentation was a viable female infant with vigorous cry. Apgar scores of 8 at one minute and 9 at five minutes. Delayed cord clamping done at 1 minute and baby handed to NICU team in attendance. Cord ph was not sent. Cord blood was obtained for evaluation. The placenta was removed Intact, spontaneously and appeared normal. The  uterine outline, tubes and ovaries appeared normal}. The uterine incision was closed with running locked sutures of . A second imbricating layer was then sutured.   Hemostasis was observed. Alexis retractor removed. Peritoneal closure done with 2-0 Vicryl.  The fascia was then reapproximated with running sutures of 0Vicryl. The subcuticular closure was performed using 2-0plain gut. The skin was closed with 4-0Vicryl.   Instrument, sponge, and needle counts were correct prior the abdominal closure and were correct at the conclusion of the case.    Findings: viable female infant, apgars 8/9; normal uterus, nml bilat tubes/ovaries   Estimated Blood Loss: 542 mL   Total IV Fluids:   Urine Output: 50CC OF clear urine  Specimens: @ORSPECIMEN @   Complications: no complications  Disposition: PACU - hemodynamically stable.   Maternal Condition: stable   Baby condition / location:  Couplet care / Skin to Skin  Attending Attestation: I performed the procedure.   Signed: Surgeon(s): Linnell Devere BRAVO, MD

## 2023-09-29 NOTE — Progress Notes (Addendum)
 No sob/cp, no n/v, tol po; no flatus Pain controlled Asking about abdominal binder for when she is ambulating No h/a, vision chanes, ruq pain  Patient Vitals for the past 24 hrs:  BP Temp Temp src Pulse Resp SpO2 Height Weight  09/29/23 0900 124/79 -- -- 79 17 -- -- --  09/29/23 0800 135/81 -- -- 82 16 -- -- --  09/29/23 0748 -- 98 F (36.7 C) Axillary -- -- -- -- --  09/29/23 0701 133/70 -- -- 85 -- -- -- --  09/29/23 0601 (!) 140/75 -- -- 80 18 -- -- --  09/29/23 0500 (!) 144/85 98.1 F (36.7 C) Oral 85 18 96 % -- --  09/29/23 0400 (!) 148/95 -- -- 83 19 94 % -- --  09/29/23 0345 (!) 146/84 -- -- 81 14 96 % -- --  09/29/23 0330 (!) 150/98 -- -- 79 16 97 % -- --  09/29/23 0315 (!) 150/95 -- -- 88 20 98 % -- --  09/29/23 0305 (!) 139/92 -- -- 84 14 98 % -- --  09/29/23 0300 (!) 160/97 99.2 F (37.3 C) Axillary 99 16 98 % -- --  09/29/23 0255 (!) 158/107 -- -- 96 (!) 23 100 % -- --  09/29/23 0253 (!) 167/118 -- -- (!) 105 20 98 % -- --  09/29/23 0252 (!) 177/104 -- -- 98 17 100 % -- --  09/29/23 0250 (!) 174/104 -- -- 98 18 99 % -- --  09/29/23 0246 (!) 145/101 -- -- 100 20 98 % -- --  09/29/23 0243 (!) 146/124 -- -- 97 -- 100 % -- --  09/29/23 0231 (!) 153/110 -- -- 85 16 100 % -- --  09/29/23 0228 (!) 127/100 -- -- 91 16 98 % -- --  09/29/23 0221 116/89 -- -- 83 18 100 % -- --  09/29/23 0215 130/85 -- -- 77 16 97 % -- --  09/28/23 2250 -- -- -- -- -- 99 % -- --  09/28/23 2240 -- -- -- -- -- 99 % -- --  09/28/23 2230 (!) 141/86 -- -- 78 -- 99 % -- --  09/28/23 2220 -- -- -- -- -- 99 % -- --  09/28/23 2210 -- -- -- -- -- 100 % -- --  09/28/23 2202 (!) 160/99 -- -- 81 -- -- -- --  09/28/23 2200 -- -- -- -- -- 100 % -- --  09/28/23 2155 (!) 161/97 -- -- 80 -- 98 % -- --  09/28/23 2138 (!) 172/110 98.3 F (36.8 C) -- 79 18 -- 4' 11 (1.499 m) 86.6 kg   A&ox3 Rrr Ctab Abd: soft,nt,nd, +bs though decreased; dressing: fundus firm and 1cm below umb; provena in place LE: no  edema,nt bilat; dtr 2+      Latest Ref Rng & Units 09/29/2023    5:23 AM 09/29/2023    2:45 AM 09/29/2023    2:37 AM  CBC  WBC 4.0 - 10.5 K/uL 18.4   12.5   Hemoglobin 12.0 - 15.0 g/dL 88.2   87.9   Hematocrit 36.0 - 46.0 % 35.3   36.9   Platelets 150 - 400 K/uL 248  248  244       Latest Ref Rng & Units 09/29/2023    5:23 AM 09/28/2023   10:12 PM 07/23/2019    1:14 AM  CMP  Glucose 70 - 99 mg/dL 899  88  848   BUN 6 - 20 mg/dL 9  12  10   Creatinine 0.44 - 1.00 mg/dL 9.29  9.41  9.41   Sodium 135 - 145 mmol/L 133  136  136   Potassium 3.5 - 5.1 mmol/L 4.5  4.0  4.0   Chloride 98 - 111 mmol/L 107  109  107   CO2 22 - 32 mmol/L 18  17  18    Calcium  8.9 - 10.3 mg/dL 8.4  9.3  9.2   Total Protein 6.5 - 8.1 g/dL 5.9  6.7  6.6   Total Bilirubin 0.0 - 1.2 mg/dL 0.4  0.2  0.1   Alkaline Phos 38 - 126 U/L 165  178  193   AST 15 - 41 U/L 21  21  21    ALT 0 - 44 U/L 15  13  17      Intake/Output Summary (Last 24 hours) at 09/29/2023 1107 Last data filed at 09/29/2023 0815 Gross per 24 hour  Intake 1335.72 ml  Output 2020 ml  Net -684.28 ml    A/P: pod 0 s/p rltcs with PPH Severe pre-e: no s/s mag toxicity; follow uop; no severe range bps since out of PACU; on procardia  90mg  xl; labs today stable and nml with only mild anemia; plan repeat labs tomorrow PPH - minimal blood out with Jada, has been off suction for >1 hr and uterus remains firm and now 1cm below umb and scant lochia; follow closely for sx of anemia Breastfeeding, girl 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

## 2023-09-30 ENCOUNTER — Encounter (HOSPITAL_COMMUNITY)
Admission: RE | Admit: 2023-09-30 | Discharge: 2023-09-30 | Disposition: A | Discharge status: Home / Self Care | Source: Ambulatory Visit | Attending: Obstetrics and Gynecology | Admitting: Obstetrics and Gynecology

## 2023-09-30 HISTORY — DX: Gestational diabetes mellitus in pregnancy, unspecified control: O24.419

## 2023-09-30 LAB — CBC
HCT: 32.8 % — ABNORMAL LOW (ref 36.0–46.0)
Hemoglobin: 10.9 g/dL — ABNORMAL LOW (ref 12.0–15.0)
MCH: 29.4 pg (ref 26.0–34.0)
MCHC: 33.2 g/dL (ref 30.0–36.0)
MCV: 88.4 fL (ref 80.0–100.0)
Platelets: 256 K/uL (ref 150–400)
RBC: 3.71 MIL/uL — ABNORMAL LOW (ref 3.87–5.11)
RDW: 15.6 % — ABNORMAL HIGH (ref 11.5–15.5)
WBC: 12.3 K/uL — ABNORMAL HIGH (ref 4.0–10.5)
nRBC: 0 % (ref 0.0–0.2)

## 2023-09-30 LAB — COMPREHENSIVE METABOLIC PANEL WITH GFR
ALT: 16 U/L (ref 0–44)
AST: 24 U/L (ref 15–41)
Albumin: 2.5 g/dL — ABNORMAL LOW (ref 3.5–5.0)
Alkaline Phosphatase: 158 U/L — ABNORMAL HIGH (ref 38–126)
Anion gap: 9 (ref 5–15)
BUN: 9 mg/dL (ref 6–20)
CO2: 23 mmol/L (ref 22–32)
Calcium: 7.1 mg/dL — ABNORMAL LOW (ref 8.9–10.3)
Chloride: 102 mmol/L (ref 98–111)
Creatinine, Ser: 0.66 mg/dL (ref 0.44–1.00)
GFR, Estimated: >60 mL/min (ref >60)
Glucose, Bld: 84 mg/dL (ref 70–99)
Potassium: 4.1 mmol/L (ref 3.5–5.1)
Sodium: 134 mmol/L — ABNORMAL LOW (ref 135–145)
Total Bilirubin: 0.2 mg/dL (ref 0.0–1.2)
Total Protein: 6.1 g/dL — ABNORMAL LOW (ref 6.5–8.1)

## 2023-09-30 LAB — GLUCOSE, CAPILLARY
Glucose-Capillary: 77 mg/dL (ref 70–99)
Glucose-Capillary: 138 mg/dL — ABNORMAL HIGH (ref 70–99)

## 2023-09-30 NOTE — Plan of Care (Signed)
  Problem: Education: Goal: Knowledge of disease or condition will improve Outcome: Progressing Goal: Knowledge of the prescribed therapeutic regimen will improve Outcome: Progressing   Problem: Fluid Volume: Goal: Peripheral tissue perfusion will improve Outcome: Progressing   Problem: Clinical Measurements: Goal: Complications related to disease process, condition or treatment will be avoided or minimized Outcome: Progressing   Problem: Education: Goal: Knowledge of General Education information will improve Description: Including pain rating scale, medication(s)/side effects and non-pharmacologic comfort measures Outcome: Progressing   Problem: Health Behavior/Discharge Planning: Goal: Ability to manage health-related needs will improve Outcome: Progressing   Problem: Clinical Measurements: Goal: Ability to maintain clinical measurements within normal limits will improve Outcome: Progressing Goal: Will remain free from infection Outcome: Progressing Goal: Diagnostic test results will improve Outcome: Progressing Goal: Respiratory complications will improve Outcome: Progressing Goal: Cardiovascular complication will be avoided Outcome: Progressing   Problem: Activity: Goal: Risk for activity intolerance will decrease Outcome: Progressing   Problem: Nutrition: Goal: Adequate nutrition will be maintained Outcome: Progressing   Problem: Coping: Goal: Level of anxiety will decrease Outcome: Progressing   Problem: Elimination: Goal: Will not experience complications related to bowel motility Outcome: Progressing Goal: Will not experience complications related to urinary retention Outcome: Progressing   Problem: Pain Managment: Goal: General experience of comfort will improve and/or be controlled Outcome: Progressing   Problem: Safety: Goal: Ability to remain free from injury will improve Outcome: Progressing   Problem: Skin Integrity: Goal: Risk for impaired  skin integrity will decrease Outcome: Progressing   Problem: Education: Goal: Knowledge of the prescribed therapeutic regimen will improve Outcome: Progressing Goal: Understanding of sexual limitations or changes related to disease process or condition will improve Outcome: Progressing   Problem: Self-Concept: Goal: Communication of feelings regarding changes in body function or appearance will improve Outcome: Progressing   Problem: Skin Integrity: Goal: Demonstration of wound healing without infection will improve Outcome: Progressing   Problem: Activity: Goal: Will verbalize the importance of balancing activity with adequate rest periods Outcome: Progressing Goal: Ability to tolerate increased activity will improve Outcome: Progressing   Problem: Coping: Goal: Ability to identify and utilize available resources and services will improve Outcome: Progressing   Problem: Life Cycle: Goal: Chance of risk for complications during the postpartum period will decrease Outcome: Progressing   Problem: Role Relationship: Goal: Ability to demonstrate positive interaction with newborn will improve Outcome: Progressing   Problem: Skin Integrity: Goal: Demonstration of wound healing without infection will improve Outcome: Progressing

## 2023-09-30 NOTE — Lactation Note (Signed)
 This note was copied from a baby's chart. Lactation Consultation Note  Patient Name: Connie Mccarty Date: 09/30/2023 Age:33 hours Reason for consult: Follow-up assessment;Other (Comment);Late-preterm 34-36.6wks;Infant weight loss (gHTN, Pre-E. - 7.17% WL)  Visited with family of 33 62/9 weeks old AGA NICU female; Connie Mccarty is a P2 and experienced breastfeeding. She has been taking baby Connie Mccarty to the breast the last 24 hours but baby has been falling asleep. She started supplementing with Similac 22 calorie formula this afternoon, but baby already at 7.17% weight loss. Re-educated about LPI behavior and the need for supplementation every 3 hours with EBM/formula for the prevention of excessive weight loss. She hasn't started pumping yet, she had a hand pump in the room. Explained how the hospital grade pump will be a superior pump for a LPI; DEBP was set up yesterday. Revised pumping schedule and supplementation guidelines according to baby's age in hours.  Feeding Mother's Current Feeding Choice: Breast Milk and Formula Nipple Type: Extra Slow Flow  Lactation Tools Discussed/Used Tools: Pump;Flanges;Coconut oil Flange Size: 21 Breast pump type: Double-Electric Breast Pump Pump Education: Setup, frequency, and cleaning;Milk Storage Reason for Pumping: LPI, < 6 lbs Pumping frequency: DEBP was set up at 11 hours post-partum but pumping still hasn't been initiated Pumped volume: 0 mL  Interventions Interventions: Breast feeding basics reviewed;Coconut oil;DEBP;LPT handout/interventions  Plan STS whenever possible Massage and hand express both breasts prior feedings/pumping Pump both breasts every 3 hours for 15 minutes, ideally 8 pumping sessions/24 hours Put baby to breast 8 times/24 hours or sooner if feeding cues are present Supplement with EBM/donor milk or formula every 3 hours after feedings/attempts at the breast. She's aware that feeding time should not exceed 30  minutes total (15 breast/15 bottle). 21 ml per feeding today and 28 ml per feeding tomorrow   No other support person at this time. All questions and concerns answered, family to contact [facility] services PRN.  Consult Status Consult Status: Follow-up Date: 10/01/23 Follow-up type: In-patient   Connie Mccarty S Connie Mccarty 09/30/2023, 4:55 PM

## 2023-09-30 NOTE — Progress Notes (Signed)
 POD #1 (8/14 1 am delivery)  Urgent repeat C/s at 36.5 wks for Superimposed severe PEC  Girl, in room, breast and formula    No sob/cp, no n/v, tol po; no flatus Pain controlled but more today, took one Oxycodone   No h/a, vision chanes, ruq pain  BP 128/78 (BP Location: Left Arm)   Pulse (!) 102   Temp 97.9 F (36.6 C) (Oral)   Resp 16   Ht 4' 11 (1.499 m)   Wt 78.8 kg   LMP 12/20/2022   SpO2 99%      BMI 35.10 kg/m   Patient Vitals for the past 24 hrs:  BP Temp Temp src Pulse Resp SpO2 Weight  09/30/23 1203 128/78 97.9 F (36.6 C) Oral (!) 102 16 99 % --  09/30/23 0802 131/87 98.1 F (36.7 C) Oral 85 16 99 % --  09/30/23 0439 138/82 98.2 F (36.8 C) Oral 89 18 99 % 78.8 kg  09/30/23 0200 -- -- -- -- 16 -- --  09/30/23 0100 -- -- -- -- 16 -- --  09/30/23 0000 -- -- -- -- 18 -- --  09/29/23 2312 136/81 98.2 F (36.8 C) Oral 93 18 99 % --  09/29/23 2300 -- -- -- -- 16 -- --  09/29/23 2200 -- -- -- -- 16 -- --  09/29/23 2100 -- -- -- -- 16 -- --  09/29/23 2000 -- -- -- -- 18 -- --  09/29/23 1930 -- -- -- -- 16 -- --  09/29/23 1920 135/81 98.5 F (36.9 C) Oral 96 18 96 % --  09/29/23 1900 131/84 -- -- 92 -- -- --  09/29/23 1801 134/73 -- -- 95 16 -- --  09/29/23 1701 131/75 -- -- 89 16 -- --  09/29/23 1600 128/79 -- -- 91 17 -- --  09/29/23 1511 130/83 -- -- 85 -- -- --  09/29/23 1501 (!) 130/92 97.9 F (36.6 C) Oral 89 15 98 % --  09/29/23 1412 101/61 -- -- -- -- -- --  09/29/23 1401 (!) 145/91 -- -- 86 16 -- --  09/29/23 1300 (!) 154/103 -- -- 90 17 -- --   A&ox3 Rrr Ctab Abd: soft,nt,nd, +bs though decreased; dressing: fundus firm and 1cm below umb; provena in place LE: no edema,nt bilat; dtr 2+      Latest Ref Rng & Units 09/30/2023    5:13 AM 09/29/2023    5:23 AM 09/29/2023    2:45 AM  CBC  WBC 4.0 - 10.5 K/uL 12.3  18.4    Hemoglobin 12.0 - 15.0 g/dL 89.0  88.2    Hematocrit 36.0 - 46.0 % 32.8  35.3    Platelets 150 - 400 K/uL 256  248  248        Latest Ref Rng & Units 09/30/2023    5:13 AM 09/29/2023    5:23 AM 09/28/2023   10:12 PM  CMP  Glucose 70 - 99 mg/dL 84  899  88   BUN 6 - 20 mg/dL 9  9  12    Creatinine 0.44 - 1.00 mg/dL 9.33  9.29  9.41   Sodium 135 - 145 mmol/L 134  133  136   Potassium 3.5 - 5.1 mmol/L 4.1  4.5  4.0   Chloride 98 - 111 mmol/L 102  107  109   CO2 22 - 32 mmol/L 23  18  17    Calcium  8.9 - 10.3 mg/dL 7.1  8.4  9.3   Total Protein 6.5 -  8.1 g/dL 6.1  5.9  6.7   Total Bilirubin 0.0 - 1.2 mg/dL 0.2  0.4  0.2   Alkaline Phos 38 - 126 U/L 158  165  178   AST 15 - 41 U/L 24  21  21    ALT 0 - 44 U/L 16  15  13      Intake/Output Summary (Last 24 hours) at 09/30/2023 1220 Last data filed at 09/30/2023 1200 Gross per 24 hour  Intake 3438.54 ml  Output 5350 ml  Net -1911.46 ml   O+ Rub immm   A/P: pod 1 s/p rltcs with PPH Severe super-imposed Preeclampsia- s/p 24 hr magnesium . BP well controlled w/ Procardia  90 mg XL.   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

## 2023-10-01 MED ORDER — LABETALOL HCL 200 MG PO TABS
200.0000 mg | ORAL_TABLET | Freq: Three times a day (TID) | ORAL | Status: DC
  Administered 2023-10-01 – 2023-10-02 (×2): 200 mg via ORAL
  Filled 2023-10-01 (×3): qty 1

## 2023-10-01 MED ORDER — LABETALOL HCL 200 MG PO TABS
200.0000 mg | ORAL_TABLET | Freq: Two times a day (BID) | ORAL | Status: DC
  Administered 2023-10-01: 200 mg via ORAL
  Filled 2023-10-01: qty 1

## 2023-10-01 NOTE — Progress Notes (Signed)
 Add Labetalol  200mg  po bid

## 2023-10-01 NOTE — Plan of Care (Signed)
  Problem: Education: Goal: Knowledge of disease or condition will improve Outcome: Progressing Goal: Knowledge of the prescribed therapeutic regimen will improve Outcome: Progressing   Problem: Fluid Volume: Goal: Peripheral tissue perfusion will improve Outcome: Progressing   Problem: Clinical Measurements: Goal: Complications related to disease process, condition or treatment will be avoided or minimized Outcome: Progressing   Problem: Education: Goal: Knowledge of General Education information will improve Description: Including pain rating scale, medication(s)/side effects and non-pharmacologic comfort measures Outcome: Progressing   Problem: Health Behavior/Discharge Planning: Goal: Ability to manage health-related needs will improve Outcome: Progressing   Problem: Clinical Measurements: Goal: Ability to maintain clinical measurements within normal limits will improve Outcome: Progressing Goal: Will remain free from infection Outcome: Progressing Goal: Diagnostic test results will improve Outcome: Progressing Goal: Respiratory complications will improve Outcome: Progressing Goal: Cardiovascular complication will be avoided Outcome: Progressing   Problem: Activity: Goal: Risk for activity intolerance will decrease Outcome: Progressing   Problem: Nutrition: Goal: Adequate nutrition will be maintained Outcome: Progressing   Problem: Coping: Goal: Level of anxiety will decrease Outcome: Progressing   Problem: Elimination: Goal: Will not experience complications related to bowel motility Outcome: Progressing Goal: Will not experience complications related to urinary retention Outcome: Progressing   Problem: Pain Managment: Goal: General experience of comfort will improve and/or be controlled Outcome: Progressing   Problem: Safety: Goal: Ability to remain free from injury will improve Outcome: Progressing   Problem: Skin Integrity: Goal: Risk for impaired  skin integrity will decrease Outcome: Progressing   Problem: Education: Goal: Knowledge of the prescribed therapeutic regimen will improve Outcome: Progressing Goal: Understanding of sexual limitations or changes related to disease process or condition will improve Outcome: Progressing   Problem: Self-Concept: Goal: Communication of feelings regarding changes in body function or appearance will improve Outcome: Progressing   Problem: Skin Integrity: Goal: Demonstration of wound healing without infection will improve Outcome: Progressing   Problem: Activity: Goal: Will verbalize the importance of balancing activity with adequate rest periods Outcome: Progressing Goal: Ability to tolerate increased activity will improve Outcome: Progressing   Problem: Coping: Goal: Ability to identify and utilize available resources and services will improve Outcome: Progressing   Problem: Life Cycle: Goal: Chance of risk for complications during the postpartum period will decrease Outcome: Progressing   Problem: Role Relationship: Goal: Ability to demonstrate positive interaction with newborn will improve Outcome: Progressing   Problem: Skin Integrity: Goal: Demonstration of wound healing without infection will improve Outcome: Progressing

## 2023-10-01 NOTE — Lactation Note (Signed)
 This note was copied from a baby's chart. Lactation Consultation Note  Patient Name: Connie Mccarty Unijb'd Date: 10/01/2023 Age:33 hours Reason for consult: Follow-up assessment;Late-preterm 34-36.6wks;Infant < 6lbs;Infant weight loss;Other (Comment);Maternal endocrine disorder (GHTN)  LC in to visit with P2 Mom of baby Camilla born at [redacted]w[redacted]d by C/S for worsening pre-eclampsia.  Baby is at a 8.9% weight loss which is a drop of 50 gm from yesterday.  Baby is now being supplemented with EBM+/22 cal formula by paced bottle, SLP consulted.  Baby just finished a bottle and was sleeping when LC arrived.  Mom still has not pumped, saying it was painful.  LC offered to assist with a hands free pumping band and smaller flanges.  LC educated Mom on the importance of pumping to support her milk supply now that baby is being supplemented regularly.  Mom agreeable to starting to pump regularly.  LC set up a washing and a drying basin for proper cleaning of pump parts, educated parents.  Mom knows to offer baby her EBM prior to formula feeding.  Plan recommended- 1- STS with baby as much as possible 2- offer the breast with cues, or awaken baby after 3 hrs if she is sleeping. 3- supplement breastfeeding with EBM+/formula per feeding volume guidelines 4- Pump both breasts for 15-20 mins.   Lactation Tools Discussed/Used Tools: Pump;Flanges;Bottle;Hands-free pumping top Flange Size: 18 Breast pump type: Double-Electric Breast Pump Pump Education: Setup, frequency, and cleaning;Milk Storage Reason for Pumping: support milk supply/infant being supplemented due to weight loss Pumping frequency: Used hand pump a couple times.  Mom encouraged to pump with DEBP and after each breast feeding when baby is supplemented Pumped volume: 10 mL  Interventions Interventions: Breast feeding basics reviewed;Skin to skin;Breast massage;Hand express;DEBP;Hand pump;Education  Discharge Pump: Hands  Free;Personal (MomCozy) WIC Program: Yes  Consult Status Consult Status: Follow-up Date: 10/02/23 Follow-up type: In-patient    Connie Mccarty 10/01/2023, 12:33 PM

## 2023-10-01 NOTE — Progress Notes (Addendum)
 POD # 2  (8/14 1 am delivery)  Urgent repeat C/s at 36.5 wks for Superimposed severe PEC  Girl, in room, breast and formula    No sob/cp, no n/v, tol po; no flatus Pain controlled but more today, took one Oxycodone   No h/a, vision chanes, ruq pain  BP 128/78 (BP Location: Left Arm)   Pulse (!) 102   Temp 97.9 F (36.6 C) (Oral)   Resp 16   Ht 4' 11 (1.499 m)   Wt 78.8 kg   LMP 12/20/2022   SpO2 99%      BMI 35.10 kg/m   Patient Vitals for the past 24 hrs:  BP Temp Temp src Pulse Resp SpO2  10/01/23 0607 (!) 129/91 98.1 F (36.7 C) Oral 86 20 100 %  10/01/23 0018 127/73 98.7 F (37.1 C) Oral (!) 104 17 100 %  09/30/23 2004 119/75 99.1 F (37.3 C) Oral (!) 102 (!) 22 97 %  09/30/23 1503 136/86 98.2 F (36.8 C) Oral (!) 104 16 98 %  09/30/23 1203 128/78 97.9 F (36.6 C) Oral (!) 102 16 99 %  09/30/23 0802 131/87 98.1 F (36.7 C) Oral 85 16 99 %   A&ox3 Rrr Ctab Abd: soft,nt,nd, +bs though decreased; dressing: fundus firm and 1cm below umb; provena in place LE: no edema,nt bilat; dtr 2+      Latest Ref Rng & Units 09/30/2023    5:13 AM 09/29/2023    5:23 AM 09/29/2023    2:45 AM  CBC  WBC 4.0 - 10.5 K/uL 12.3  18.4    Hemoglobin 12.0 - 15.0 g/dL 89.0  88.2    Hematocrit 36.0 - 46.0 % 32.8  35.3    Platelets 150 - 400 K/uL 256  248  248       Latest Ref Rng & Units 09/30/2023    5:13 AM 09/29/2023    5:23 AM 09/28/2023   10:12 PM  CMP  Glucose 70 - 99 mg/dL 84  899  88   BUN 6 - 20 mg/dL 9  9  12    Creatinine 0.44 - 1.00 mg/dL 9.33  9.29  9.41   Sodium 135 - 145 mmol/L 134  133  136   Potassium 3.5 - 5.1 mmol/L 4.1  4.5  4.0   Chloride 98 - 111 mmol/L 102  107  109   CO2 22 - 32 mmol/L 23  18  17    Calcium  8.9 - 10.3 mg/dL 7.1  8.4  9.3   Total Protein 6.5 - 8.1 g/dL 6.1  5.9  6.7   Total Bilirubin 0.0 - 1.2 mg/dL 0.2  0.4  0.2   Alkaline Phos 38 - 126 U/L 158  165  178   AST 15 - 41 U/L 24  21  21    ALT 0 - 44 U/L 16  15  13      Intake/Output Summary  (Last 24 hours) at 10/01/2023 0717 Last data filed at 09/30/2023 1800 Gross per 24 hour  Intake 960 ml  Output 1750 ml  Net -790 ml   O+ Rub immm   A/P: pod 2 s/p rltcs with PPH Severe super-imposed Preeclampsia- s/p 24 hr magnesium . BP well controlled w/ Procardia  90 mg XL.   ABL anemia, mild asymptomatic  start PO labs  PPH - s/p Jada for 6 hrs, bleeding better since removed yesterday  Breastfeeding and formula since preterm, girl Gdma1 -wnl, encourage high protein diet Obesity - encourage ambulating  Discharge home  F/up Dr Linnell in 3-4 days for BP check PP care/ post op care dw pt   --Robbi Render MD   Addendum RN informed me baby may not get discharge, Mom advised to stay as in-pt if baby not going home so we can continue meds and assess BP

## 2023-10-02 MED ORDER — NIFEDIPINE ER OSMOTIC RELEASE 90 MG PO TB24: 90.0000 mg | ORAL_TABLET | Freq: Every day | ORAL | 1 refills | Status: AC

## 2023-10-02 MED ORDER — LABETALOL HCL 200 MG PO TABS: 200.0000 mg | ORAL_TABLET | Freq: Three times a day (TID) | ORAL | 1 refills | Status: AC

## 2023-10-02 MED ORDER — IBUPROFEN 600 MG PO TABS: 600.0000 mg | ORAL_TABLET | Freq: Four times a day (QID) | ORAL | 0 refills | Status: AC

## 2023-10-02 MED ORDER — OXYCODONE HCL 5 MG PO TABS: 5.0000 mg | ORAL_TABLET | ORAL | 0 refills | Status: AC | PRN

## 2023-10-02 NOTE — Progress Notes (Signed)
 No c/o, pain controlled, +flatus Voids w/o difficulty, nml lochia Tol po, ambulating Breast/formula, girl  Patient Vitals for the past 24 hrs:  BP Temp Temp src Pulse Resp SpO2 Weight  10/02/23 0812 128/80 98.4 F (36.9 C) Oral 87 18 100 % --  10/02/23 0415 -- -- -- -- -- 100 % 75.8 kg  10/02/23 0414 128/87 98.2 F (36.8 C) Oral 88 18 -- --  10/02/23 0020 112/78 98 F (36.7 C) Oral 99 18 98 % --  10/01/23 2208 (!) 149/93 -- -- (!) 107 -- -- --  10/01/23 2021 (!) 154/98 98 F (36.7 C) Oral (!) 109 19 100 % --  10/01/23 1457 (!) 128/92 98.2 F (36.8 C) Oral (!) 106 18 99 % --  10/01/23 1240 (!) 137/98 (!) 97.5 F (36.4 C) Oral (!) 104 18 98 % --  10/01/23 1207 (!) 153/98 98 F (36.7 C) Oral (!) 122 16 100 % --   A&ox3 Rrr Ctab Abd: soft,nt,nd; +bs; provena in place; fundus firm and below umb LE: no edema,nt bilat     Latest Ref Rng & Units 09/30/2023    5:13 AM 09/29/2023    5:23 AM 09/29/2023    2:45 AM  CBC  WBC 4.0 - 10.5 K/uL 12.3  18.4    Hemoglobin 12.0 - 15.0 g/dL 89.0  88.2    Hematocrit 36.0 - 46.0 % 32.8  35.3    Platelets 150 - 400 K/uL 256  248  248       Latest Ref Rng & Units 09/30/2023    5:13 AM 09/29/2023    5:23 AM 09/28/2023   10:12 PM  CMP  Glucose 70 - 99 mg/dL 84  899  88   BUN 6 - 20 mg/dL 9  9  12    Creatinine 0.44 - 1.00 mg/dL 9.33  9.29  9.41   Sodium 135 - 145 mmol/L 134  133  136   Potassium 3.5 - 5.1 mmol/L 4.1  4.5  4.0   Chloride 98 - 111 mmol/L 102  107  109   CO2 22 - 32 mmol/L 23  18  17    Calcium  8.9 - 10.3 mg/dL 7.1  8.4  9.3   Total Protein 6.5 - 8.1 g/dL 6.1  5.9  6.7   Total Bilirubin 0.0 - 1.2 mg/dL 0.2  0.4  0.2   Alkaline Phos 38 - 126 U/L 158  165  178   AST 15 - 41 U/L 24  21  21    ALT 0 - 44 U/L 16  15  13       A/P: pod3 s/p rltcs with pph Doing well, d/c home Abl anemia - asymptomatic, not sig; iron daily Breast/formula girl Gdma 1 - f/u pp Obesity  RH pos Rub immune CHTN with superimposed severe pre-e: s/p  24 hr mag sulfate, procardia  90mg  xl, labetalol  200mg  bid; f/u 4d for bp check and dressing removal

## 2023-10-02 NOTE — Discharge Summary (Signed)
 Postpartum Discharge Summary  Date of Service updated     Patient Name: Connie Mccarty DOB: May 22, 1990 MRN: [id number]  Date of admission: 09/28/2023 Delivery date:09/29/2023 Delivering provider: LINNELL PULLING E Date of discharge: 10/02/2023  Admitting diagnosis: Previous cesarean section [Z98.891] Severe pre-eclampsia [O14.10] Intrauterine pregnancy: [redacted]w[redacted]d     Secondary diagnosis:  Principal Problem:   Postpartum care following cesarean delivery 8/14 Active Problems:   Previous cesarean section   Severe pre-eclampsia   Status post repeat low transverse cesarean section   PPH (postpartum hemorrhage)  Additional problems: anemia    Discharge diagnosis: Preterm Pregnancy Delivered, Preeclampsia (severe), and PPH                                              Complications: [facility]  Hospital course: Sceduled C/S   33 y.o. yo G2P1102 at [redacted]w[redacted]d was admitted to the hospital 09/28/2023 for scheduled cesarean section with the following indication:severe pre-eclampsia, h/o c/s.Delivery details are as follows:  Membrane Rupture Time/Date: 1:06 AM,09/29/2023  Delivery Method:C-Section, Low Transverse Details of operation can be found in separate operative note.  Patient had a postpartum course complicated by anemia, elevated bps - on procardia ,labetalol .  She is ambulating, tolerating a regular diet, passing flatus, and urinating well. Patient is discharged home in stable condition on  10/02/23        Newborn Data: Birth date:09/29/2023 Birth time:1:07 AM Gender:Female Living status:Living Apgars:8 ,9  Weight:2860 g    Magnesium  Sulfate received: Yes: Seizure prophylaxis  Immunizations administered: There is no immunization history for the selected administration types on file for this patient.  Physical exam  Vitals:   10/02/23 0414 10/02/23 0415 10/02/23 0812 10/02/23 1131  BP: 128/87  128/80 136/89  Pulse: 88  87 92  Resp: 18  18 18   Temp: 98.2 F (36.8 C)  98.4 F (36.9  C) 98.5 F (36.9 C)  TempSrc: Oral  Oral Oral  SpO2:  100% 100% 100%  Weight:  75.8 kg    Height:       Labs: Lab Results  Component Value Date   WBC 12.3 (H) 09/30/2023   HGB 10.9 (L) 09/30/2023   HCT 32.8 (L) 09/30/2023   MCV 88.4 09/30/2023   PLT 256 09/30/2023      Latest Ref Rng & Units 09/30/2023    5:13 AM  CMP  Glucose 70 - 99 mg/dL 84   BUN 6 - 20 mg/dL 9   Creatinine 9.55 - 8.99 mg/dL 9.33   Sodium 864 - 854 mmol/L 134   Potassium 3.5 - 5.1 mmol/L 4.1   Chloride 98 - 111 mmol/L 102   CO2 22 - 32 mmol/L 23   Calcium  8.9 - 10.3 mg/dL 7.1   Total Protein 6.5 - 8.1 g/dL 6.1   Total Bilirubin 0.0 - 1.2 mg/dL 0.2   Alkaline Phos 38 - 126 U/L 158   AST 15 - 41 U/L 24   ALT 0 - 44 U/L 16    Edinburgh Score:    09/30/2023    4:07 PM  Edinburgh Postnatal Depression Scale Screening Tool  I have been able to laugh and see the funny side of things. 0  I have looked forward with enjoyment to things. 0  I have blamed myself unnecessarily when things went wrong. 0  I have been anxious or worried for no  good reason. 1  I have felt scared or panicky for no good reason. 1  Things have been getting on top of me. 0  I have been so unhappy that I have had difficulty sleeping. 0  I have felt sad or miserable. 0  I have been so unhappy that I have been crying. 0  The thought of harming myself has occurred to me. 0  Edinburgh Postnatal Depression Scale Total 2      After visit meds:  Allergies as of 10/02/2023   No Known Allergies      Medication List     STOP taking these medications    aspirin  81 MG chewable tablet   ondansetron  4 MG disintegrating tablet Commonly known as: ZOFRAN -ODT       TAKE these medications    ibuprofen  600 MG tablet Commonly known as: ADVIL  Take 1 tablet (600 mg total) by mouth every 6 (six) hours.   labetalol  200 MG tablet Commonly known as: NORMODYNE  Take 1 tablet (200 mg total) by mouth every 8 (eight) hours. What changed:   medication strength how much to take when to take this Another medication with the same name was removed. Continue taking this medication, and follow the directions you see here.   NIFEdipine  90 MG 24 hr tablet Commonly known as: PROCARDIA  XL/NIFEDICAL-XL Take 1 tablet (90 mg total) by mouth daily. Start taking on: October 03, 2023   oxyCODONE  5 MG immediate release tablet Commonly known as: Oxy IR/ROXICODONE  Take 1-2 tablets (5-10 mg total) by mouth every 4 (four) hours as needed for moderate pain (pain score 4-6).   prenatal multivitamin Tabs tablet Take 1 tablet by mouth daily at 12 noon.         Discharge home in stable condition Infant Disposition:home with mother Discharge instruction: per After Visit Summary and Postpartum booklet. Activity: Advance as tolerated. Pelvic rest for 6 weeks.  Diet: carb modified diet and low salt diet Anticipated Birth Control: Unsure Postpartum Appointment:6 wk Additional Postpartum F/U: BP check 4 days with dressing removal Future Appointments:No future appointments. Follow up Visit:      10/02/2023 Devere FORBES Brave, MD

## 2023-10-02 NOTE — Lactation Note (Signed)
 This note was copied from a baby's chart. Lactation Consultation Note  Patient Name: Connie Mccarty Unijb'd Date: 10/02/2023 Age:33 days Reason for consult: Follow-up assessment;Maternal discharge;Early term 68-38.6wks;Maternal endocrine disorder  LC in to visit with P2 Mom of ET infant on day of discharge.  Baby has been going to the breast more often now and still supplementing with formula.  Mom states she pumped a couple times.  Baby is at a 6.1% weight loss with good output.  LC encouraged offering the breast at each feeding, supplementing and pumping after.  LC recommended OP lactation appointment, Mom politely declined.  LC provided contact information and encouraged Mom to call prn for support.  Engorgement prevention and treatment reviewed. Mom encouraged STS with baby, and offering the breast often with feeding cues.  LC recommended pumping with supplementation to support a full milk supply. Mom denied any questions.   Lactation Tools Discussed/Used Tools: Pump;Flanges;Bottle;Hands-free pumping top Flange Size: 18 Breast pump type: Double-Electric Breast Pump;Manual Reason for Pumping: support milk supply Pumping frequency: not consistent, but occasional Pumped volume: 20 mL  Interventions Interventions: Breast feeding basics reviewed;Skin to skin;Breast massage;Hand express;Hand pump;Education  Discharge Discharge Education: Engorgement and breast care;Outpatient recommendation (Mom declined OP appt, contact information provided) Pump: Hands Free;Personal  Consult Status Consult Status: Complete Date: 10/02/23 Follow-up type: Call as needed    Claudene Aleck BRAVO 10/02/2023, 12:06 PM

## 2023-10-02 NOTE — Plan of Care (Signed)
  Problem: Education: Goal: Knowledge of disease or condition will improve Outcome: Completed/Met Goal: Knowledge of the prescribed therapeutic regimen will improve Outcome: Completed/Met   Problem: Fluid Volume: Goal: Peripheral tissue perfusion will improve Outcome: Completed/Met   Problem: Clinical Measurements: Goal: Complications related to disease process, condition or treatment will be avoided or minimized Outcome: Completed/Met   Problem: Education: Goal: Knowledge of General Education information will improve Description: Including pain rating scale, medication(s)/side effects and non-pharmacologic comfort measures Outcome: Completed/Met   Problem: Health Behavior/Discharge Planning: Goal: Ability to manage health-related needs will improve Outcome: Completed/Met   Problem: Clinical Measurements: Goal: Ability to maintain clinical measurements within normal limits will improve Outcome: Completed/Met Goal: Will remain free from infection Outcome: Completed/Met Goal: Diagnostic test results will improve Outcome: Completed/Met Goal: Respiratory complications will improve Outcome: Completed/Met Goal: Cardiovascular complication will be avoided Outcome: Completed/Met   Problem: Activity: Goal: Risk for activity intolerance will decrease Outcome: Completed/Met   Problem: Nutrition: Goal: Adequate nutrition will be maintained Outcome: Completed/Met   Problem: Coping: Goal: Level of anxiety will decrease Outcome: Completed/Met   Problem: Elimination: Goal: Will not experience complications related to bowel motility Outcome: Completed/Met Goal: Will not experience complications related to urinary retention Outcome: Completed/Met   Problem: Pain Managment: Goal: General experience of comfort will improve and/or be controlled Outcome: Completed/Met   Problem: Safety: Goal: Ability to remain free from injury will improve Outcome: Completed/Met   Problem: Skin  Integrity: Goal: Risk for impaired skin integrity will decrease Outcome: Completed/Met   Problem: Education: Goal: Knowledge of the prescribed therapeutic regimen will improve Outcome: Completed/Met Goal: Understanding of sexual limitations or changes related to disease process or condition will improve Outcome: Completed/Met   Problem: Self-Concept: Goal: Communication of feelings regarding changes in body function or appearance will improve Outcome: Completed/Met   Problem: Skin Integrity: Goal: Demonstration of wound healing without infection will improve Outcome: Completed/Met   Problem: Activity: Goal: Will verbalize the importance of balancing activity with adequate rest periods Outcome: Completed/Met Goal: Ability to tolerate increased activity will improve Outcome: Completed/Met   Problem: Coping: Goal: Ability to identify and utilize available resources and services will improve Outcome: Completed/Met   Problem: Life Cycle: Goal: Chance of risk for complications during the postpartum period will decrease Outcome: Completed/Met   Problem: Role Relationship: Goal: Ability to demonstrate positive interaction with newborn will improve Outcome: Completed/Met   Problem: Skin Integrity: Goal: Demonstration of wound healing without infection will improve Outcome: Completed/Met

## 2023-10-03 ENCOUNTER — Encounter (HOSPITAL_COMMUNITY): Admission: RE | Payer: Self-pay | Source: Home / Self Care

## 2023-10-03 ENCOUNTER — Inpatient Hospital Stay (HOSPITAL_COMMUNITY): Admission: RE | Admit: 2023-10-03 | Source: Home / Self Care | Admitting: Obstetrics and Gynecology

## 2023-10-03 LAB — SURGICAL PATHOLOGY

## 2023-10-03 SURGERY — Surgical Case
Anesthesia: Spinal

## 2023-10-11 ENCOUNTER — Telehealth (HOSPITAL_COMMUNITY): Payer: Self-pay

## 2023-10-11 NOTE — Telephone Encounter (Signed)
 10/11/2023 1455  Name: DORNA MALLET MRN: [id number] DOB: [birth year]  Reason for Call:  Transition of Care Hospital Discharge Call  Contact Status: Patient Contact Status: Complete  Language assistant needed:          Follow-Up Questions: Do You Have Any Concerns About Your Health As You Heal From Delivery?: No Do You Have Any Concerns About Your Infants Health?: No  Edinburgh Postnatal Depression Scale:  In the Past 7 Days: I have been able to laugh and see the funny side of things.: As much as I always could I have looked forward with enjoyment to things.: As much as I ever did I have blamed myself unnecessarily when things went wrong.: No, never I have been anxious or worried for no good reason.: Hardly ever I have felt scared or panicky for no good reason.: No, not much Things have been getting on top of me.: No, I have been coping as well as ever I have been so unhappy that I have had difficulty sleeping.: Not at all I have felt sad or miserable.: No, not at all I have been so unhappy that I have been crying.: No, never The thought of harming myself has occurred to me.: Never Edinburgh Postnatal Depression Scale Total: 2  PHQ2-9 Depression Scale:     Discharge Follow-up: Edinburgh score requires follow up?: No Patient was advised of the following resources:: Breastfeeding Support Group, Support Group  Post-discharge interventions: Reviewed Newborn Safe Sleep Practices  Signature  Rosaline Deretha PEAK

## 2023-10-31 ENCOUNTER — Encounter (HOSPITAL_COMMUNITY): Payer: Self-pay | Admitting: Obstetrics and Gynecology
